# Patient Record
Sex: Male | Born: 1980 | Race: White | Hispanic: No | Marital: Married | Smoking: Former smoker
Health system: Southern US, Community
[De-identification: ages and names within clinical notes are randomized; demographics above are authoritative.]

## PROBLEM LIST (undated history)

## (undated) DIAGNOSIS — R0683 Snoring: Secondary | ICD-10-CM

## (undated) DIAGNOSIS — R0981 Nasal congestion: Secondary | ICD-10-CM

## (undated) DIAGNOSIS — J342 Deviated nasal septum: Secondary | ICD-10-CM

## (undated) DIAGNOSIS — R040 Epistaxis: Secondary | ICD-10-CM

## (undated) DIAGNOSIS — E785 Hyperlipidemia, unspecified: Secondary | ICD-10-CM

## (undated) HISTORY — DX: Hyperlipidemia, unspecified: E78.5

## (undated) HISTORY — PX: HERNIA REPAIR: SHX51

## (undated) HISTORY — DX: Epistaxis: R04.0

## (undated) HISTORY — PX: TONSILLECTOMY: SUR1361

## (undated) HISTORY — DX: Nasal congestion: R09.81

---

## 2015-07-09 DIAGNOSIS — R030 Elevated blood-pressure reading, without diagnosis of hypertension: Secondary | ICD-10-CM | POA: Insufficient documentation

## 2017-06-26 HISTORY — PX: VASECTOMY: SHX75

## 2019-10-16 ENCOUNTER — Telehealth: Payer: Self-pay

## 2019-10-16 NOTE — Telephone Encounter (Signed)
Please advise 

## 2019-10-16 NOTE — Telephone Encounter (Signed)
Copied from [place] [id number]. Topic: Appointment Scheduling - Scheduling Inquiry for Clinic >> Oct 16, 2019  3:22 PM Sheran Luz wrote: Patient calling to inquire if Dr. Larose Kells would accept him as a new patient. Patient is aware that provider is not accepting new patients right now without approval. Patient states he was recommended by three separate patients of Dr. Larose Kells and is asking to be considered for a new patient appointment.    Thea Alken, Dewain Penning and Corwin Levins- Patient's who recommended patient to contact office to set up NP appointment with Dr. Larose Kells.

## 2019-10-16 NOTE — Telephone Encounter (Signed)
Please arrange a new patient visit at his convenience

## 2019-10-17 NOTE — Telephone Encounter (Signed)
Okay to schedule NP appt at his convenience. Thank you.  

## 2019-10-23 NOTE — Telephone Encounter (Signed)
Done

## 2019-10-26 ENCOUNTER — Other Ambulatory Visit: Payer: Self-pay

## 2019-10-30 ENCOUNTER — Ambulatory Visit: Payer: BC Managed Care – PPO | Admitting: Internal Medicine

## 2019-10-31 ENCOUNTER — Other Ambulatory Visit: Payer: Self-pay

## 2019-10-31 ENCOUNTER — Encounter: Payer: Self-pay | Admitting: Internal Medicine

## 2019-10-31 ENCOUNTER — Ambulatory Visit: Payer: BC Managed Care – PPO | Admitting: Internal Medicine

## 2019-10-31 VITALS — Ht 72.0 in | Wt 195.0 lb

## 2019-10-31 DIAGNOSIS — N5089 Other specified disorders of the male genital organs: Secondary | ICD-10-CM | POA: Diagnosis not present

## 2019-10-31 DIAGNOSIS — E785 Hyperlipidemia, unspecified: Secondary | ICD-10-CM | POA: Diagnosis not present

## 2019-10-31 DIAGNOSIS — J069 Acute upper respiratory infection, unspecified: Secondary | ICD-10-CM | POA: Diagnosis not present

## 2019-10-31 DIAGNOSIS — R0683 Snoring: Secondary | ICD-10-CM | POA: Diagnosis not present

## 2019-10-31 NOTE — Progress Notes (Signed)
Subjective:    Patient ID: George Robles, male    DOB: Sep 04, 1981, 39 y.o.   MRN: [id number]  DOS:  10/31/2019 Type of visit - description: Virtual Visit via Video Note  I connected with the above patient  by a video enabled telemedicine application and verified that I am speaking with the correct person using two identifiers.   THIS ENCOUNTER IS A VIRTUAL VISIT DUE TO COVID-19 - PATIENT WAS NOT SEEN IN THE OFFICE. PATIENT HAS CONSENTED TO VIRTUAL VISIT / TELEMEDICINE VISIT   Location of patient: home  Location of provider: office  I discussed the limitations of evaluation and management by telemedicine and the availability of in person appointments. The patient expressed understanding and agreed to proceed.  History of Present Illness: New patient The patient has multiple concerns  History of high cholesterol, on no medications.  Has excellent lifestyle  2 weeks ago developed sore throat runny nose, also some bloody nasal discharge. At the time he did not have fever, chills. No nausea, vomiting, diarrhea. No cough or difficulty breathing. In general he feels better now but is still having some congestion early in the morning. On looking back he has on and off nosebleeding mostly from the left side.  Long  history of snoring, he denies feeling tired in the morning and he does not fall asleep inappropriately.   Also, 3 weeks ago noted a lump at the left scrotum, the size of a pea, not tender, seems to be outside the testicle by his own examination.Marland Kitchen  History of vasectomy.   Review of Systems See above  Past Medical History:  Diagnosis Date  . Hyperlipidemia     Past Surgical History:  Procedure Laterality Date  . HERNIA REPAIR     8 months old  . TONSILLECTOMY    . VASECTOMY  06/2017    Social History   Socioeconomic History  . Marital status: Married    Spouse name: Not on file  . Number of children: 2  . Years of education: Not on file  . Highest  education level: Not on file  Occupational History  . Occupation: Data processing manager  Tobacco Use  . Smoking status: Former Smoker    Quit date: 2010    Years since quitting: 11.0  . Smokeless tobacco: Never Used  Substance and Sexual Activity  . Alcohol use: Yes    Alcohol/week: 10.0 standard drinks    Types: 10 Cans of beer per week    Comment: socially   . Drug use: Not Currently  . Sexual activity: Not on file  Other Topics Concern  . Not on file  Social History Narrative   Has twins    Social Determinants of Health   Financial Resource Strain:   . Difficulty of Paying Living Expenses: Not on file  Food Insecurity:   . Worried About Programme researcher, broadcasting/film/video in the Last Year: Not on file  . Ran Out of Food in the Last Year: Not on file  Transportation Needs:   . Lack of Transportation (Medical): Not on file  . Lack of Transportation (Non-Medical): Not on file  Physical Activity:   . Days of Exercise per Week: Not on file  . Minutes of Exercise per Session: Not on file  Stress:   . Feeling of Stress : Not on file  Social Connections:   . Frequency of Communication with Friends and Family: Not on file  . Frequency of Social Gatherings with Friends and Family:  Not on file  . Attends Religious Services: Not on file  . Active Member of Clubs or Organizations: Not on file  . Attends Archivist Meetings: Not on file  . Marital Status: Not on file  Intimate Partner Violence:   . Fear of Current or Ex-Partner: Not on file  . Emotionally Abused: Not on file  . Physically Abused: Not on file  . Sexually Abused: Not on file     Family History  Problem Relation Age of Onset  . Hyperlipidemia Father   . Cancer Maternal Grandmother   . Heart disease Paternal Grandmother   . Hyperlipidemia Paternal Grandmother   . Diabetes Paternal Grandmother   . Emphysema Paternal Grandfather   . Colon cancer Neg Hx   . Prostate cancer Neg Hx      Allergies as of 10/31/2019   No Known  Allergies     Medication List       Accurate as of October 31, 2019 11:59 PM. If you have any questions, ask your nurse or doctor.        Red Yeast Rice 600 MG Caps Take 2 capsules by mouth daily.           Objective:   Physical Exam Ht 6' (1.829 m)   Wt 195 lb (88.5 kg)   BMI 26.45 kg/m  Is a virtual video visit The patient is alert oriented x3 in no apparent distress    Assessment    Assessment Hyperlipidemia Snoring  PLAN New patient Dyslipidemia: Never took medication, has excellent lifestyle, currently training for marathon, also take yeast rice twice a day.  We will check his FLP when he comes back in person URI, nosebleeds: The patient has a runny nose and sore throat since Christmas, never had any other symptoms, my suspicion for Covid is low, other family members not symptomatic, he has the option to be check for Covid at his job and that would be okay, advised him to keep me posted. Consider ENT referral for persistent, on and off nosebleeds Snoring: Chronic snoring without oral symptoms, BMI is 26, suspicion for OSA is low.  Advised to consider seeing his dentist and get an appliance.  At some point we could arrange for sleep study if needed. Mass, left scrotum: Unable to assess that virtually, will get a ultrasound RTC in person CPX in 2 weeks.  If he continue with runny nose with no other Covid symptoms okay to be seen.   Today, I spent more than  30  min with the patient: >50% of the time counseling regards to his multiple new problems including snoring, ordering a test to evaluate the left scrotum.    I discussed the assessment and treatment plan with the patient. The patient was provided an opportunity to ask questions and all were answered. The patient agreed with the plan and demonstrated an understanding of the instructions.   The patient was advised to call back or seek an in-person evaluation if the symptoms worsen or if the condition fails to  improve as anticipated.

## 2019-11-01 DIAGNOSIS — R0683 Snoring: Secondary | ICD-10-CM | POA: Insufficient documentation

## 2019-11-01 DIAGNOSIS — E785 Hyperlipidemia, unspecified: Secondary | ICD-10-CM | POA: Insufficient documentation

## 2019-11-01 DIAGNOSIS — Z09 Encounter for follow-up examination after completed treatment for conditions other than malignant neoplasm: Secondary | ICD-10-CM | POA: Insufficient documentation

## 2019-11-01 NOTE — Assessment & Plan Note (Signed)
New patient Dyslipidemia: Never took medication, has excellent lifestyle, currently training for marathon, also take yeast rice twice a day.  We will check his FLP when he comes back in person URI, nosebleeds: The patient has a runny nose and sore throat since Christmas, never had any other symptoms, my suspicion for Covid is low, other family members not symptomatic, he has the option to be check for Covid at his job and that would be okay, advised him to keep me posted. Consider ENT referral for persistent, on and off nosebleeds Snoring: Chronic snoring without oral symptoms, BMI is 26, suspicion for OSA is low.  Advised to consider seeing his dentist and get an appliance.  At some point we could arrange for sleep study if needed. Mass, left scrotum: Unable to assess that virtually, will get a ultrasound RTC in person CPX in 2 weeks.  If he continue with runny nose with no other Covid symptoms okay to be seen.

## 2019-11-02 ENCOUNTER — Other Ambulatory Visit: Payer: Self-pay

## 2019-11-02 ENCOUNTER — Ambulatory Visit (HOSPITAL_BASED_OUTPATIENT_CLINIC_OR_DEPARTMENT_OTHER)
Admission: RE | Admit: 2019-11-02 | Discharge: 2019-11-02 | Disposition: A | Discharge status: Home / Self Care | Payer: BC Managed Care – PPO | Source: Ambulatory Visit | Attending: Internal Medicine | Admitting: Internal Medicine

## 2019-11-02 DIAGNOSIS — N5089 Other specified disorders of the male genital organs: Secondary | ICD-10-CM | POA: Diagnosis present

## 2019-11-15 ENCOUNTER — Encounter: Payer: Self-pay | Admitting: Internal Medicine

## 2019-11-15 ENCOUNTER — Ambulatory Visit (INDEPENDENT_AMBULATORY_CARE_PROVIDER_SITE_OTHER): Payer: BC Managed Care – PPO | Admitting: Internal Medicine

## 2019-11-15 ENCOUNTER — Other Ambulatory Visit: Payer: Self-pay

## 2019-11-15 ENCOUNTER — Ambulatory Visit: Payer: BC Managed Care – PPO | Admitting: Internal Medicine

## 2019-11-15 VITALS — BP 135/78 | HR 66 | Temp 97.5°F | Resp 16 | Ht 72.0 in | Wt 209.0 lb

## 2019-11-15 DIAGNOSIS — N5089 Other specified disorders of the male genital organs: Secondary | ICD-10-CM | POA: Diagnosis not present

## 2019-11-15 DIAGNOSIS — Z23 Encounter for immunization: Secondary | ICD-10-CM | POA: Diagnosis not present

## 2019-11-15 DIAGNOSIS — Z0000 Encounter for general adult medical examination without abnormal findings: Secondary | ICD-10-CM

## 2019-11-15 DIAGNOSIS — R040 Epistaxis: Secondary | ICD-10-CM | POA: Diagnosis not present

## 2019-11-15 DIAGNOSIS — E785 Hyperlipidemia, unspecified: Secondary | ICD-10-CM

## 2019-11-15 NOTE — Progress Notes (Signed)
Subjective:    Patient ID: George Robles, male    DOB: August 23, 1981, 40 y.o.   MRN: [id number]  DOS:  11/15/2019 Type of visit - description: CPX In general feeling well.  We also discussed other issues: Chronic history of L>R nosebleeds, sometimes severe. He denies easy bruising, blood in the stools or in the urine or gum bleeding.   Review of Systems  Other than above, a 14 point review of systems is negative  v  Past Medical History:  Diagnosis Date  . Hyperlipidemia     Past Surgical History:  Procedure Laterality Date  . HERNIA REPAIR     8 months old  . TONSILLECTOMY    . VASECTOMY  06/2017   Family History  Problem Relation Age of Onset  . Hyperlipidemia Father   . Cancer Maternal Grandmother   . Heart disease Paternal Grandmother   . Hyperlipidemia Paternal Grandmother   . Diabetes Paternal Grandmother   . Emphysema Paternal Grandfather   . Colon cancer Neg Hx   . Prostate cancer Neg Hx         Objective:   Physical Exam BP 135/78 (BP Location: Right Arm, Patient Position: Sitting, Cuff Size: Normal)   Pulse 66   Temp (!) 97.5 F (36.4 C) (Temporal)   Resp 16   Ht 6' (1.829 m)   Wt 209 lb (94.8 kg)   SpO2 99%   BMI 28.35 kg/m   General: Well developed, NAD, BMI noted Neck: No  thyromegaly  HEENT:  Normocephalic . Face symmetric, atraumatic Nose: Anterior nostril normal to inspection. Lungs: CTA B Normal respiratory effort, no intercostal retractions, no accessory muscle use. Heart: RRR,  no murmur.  No pretibial edema bilaterally  Abdomen:  Not distended, soft, non-tender. No rebound or rigidity.   GU: Penis normal, on palpation testicles are normal Scrotum: I do feel a cyst at the proximal left epididymis.  Not tender Skin: Exposed areas without rash. Not pale. Not jaundice Neurologic:  alert & oriented X3.  Speech normal, gait appropriate for age and unassisted Strength symmetric and appropriate for age.   Psych: Cognition and judgment appear intact.  Cooperative with normal attention span and concentration.  Behavior appropriate. No anxious or depressed appearing.     Assessment      Assessment Hyperlipidemia Snoring + FH high cholesterol   PLAN Here for CPX Hyperlipidemia: On red yeast rice, has a strong family history of high cholesterol.  He has a very healthy lifestyle.  He is not fasting but we will go ahead and check a FLP Nosebleeds: Recurrent, sometimes severe, anterior nostrils normal, refer to ENT for possible more detailed exam.  Checking a CBC, PT, PTT Mass, left scrotum: Clinical exam show normal testicles with a lump at the left epididymis however ultrasound is showing question of tubular ectasia.  See report below.  Plan: Self testicular exam, call if any changes, ultrasound in 3 months.  Urology referral if abnormality persists. Ultrasound report: oblong hypoechoic region within LEFT testis 5 x 4 mm in diameter extending 27 mm length, question developing tubular ectasia of the rete testis though this is not definitive; follow-up ultrasound recommended in 6 months to reassess. Epididymal cyst versus spermatocele 2.0 cm greatest size at LEFT epididymal head. Small BILATERAL hydroceles.  Snoring: See last visit RTC 1 year  This visit occurred during the SARS-CoV-2 public health emergency.  Safety protocols were in place, including screening questions prior to the visit, additional usage of staff PPE,  and extensive cleaning of exam room while observing appropriate contact time as indicated for disinfecting solutions.

## 2019-11-15 NOTE — Assessment & Plan Note (Signed)
-  Tdap: Less than 10 years per patient.  No documentation -Flu shot today -CCS: Not indicated -Prostate cancer screening: Not indicated -Labs: CMP, FLP, CBC, PT, PTT, TSH -Has an excellent lifestyle, eating healthy, getting ready for a marathon.

## 2019-11-15 NOTE — Progress Notes (Signed)
Pre visit review using our clinic review tool, if applicable. No additional management support is needed unless otherwise documented below in the visit note. 

## 2019-11-15 NOTE — Patient Instructions (Signed)
GO TO THE LAB : Get the blood work     GO TO THE FRONT DESK Schedule your next appointment   for a physical exam in 1 year  We are going to recheck a ultrasound of your scrotum, please call the office in 3 months to arrange.  If the lump at the testicle changes, please call me immediately.

## 2019-11-16 ENCOUNTER — Encounter: Payer: Self-pay | Admitting: Internal Medicine

## 2019-11-16 DIAGNOSIS — R040 Epistaxis: Secondary | ICD-10-CM

## 2019-11-16 LAB — TSH: TSH: 1.29 u[IU]/mL (ref 0.35–4.50)

## 2019-11-16 LAB — CBC WITH DIFFERENTIAL/PLATELET
Basophils Absolute: 0 10*3/uL (ref 0.0–0.1)
Basophils Relative: 0.8 % (ref 0.0–3.0)
Eosinophils Absolute: 0.1 10*3/uL (ref 0.0–0.7)
Eosinophils Relative: 1.8 % (ref 0.0–5.0)
HCT: 42 % (ref 39.0–52.0)
Hemoglobin: 14.5 g/dL (ref 13.0–17.0)
Lymphocytes Relative: 23.7 % (ref 12.0–46.0)
Lymphs Abs: 1.5 10*3/uL (ref 0.7–4.0)
MCHC: 34.5 g/dL (ref 30.0–36.0)
MCV: 95.8 fl (ref 78.0–100.0)
Monocytes Absolute: 0.6 10*3/uL (ref 0.1–1.0)
Monocytes Relative: 9.1 % (ref 3.0–12.0)
Neutro Abs: 4 10*3/uL (ref 1.4–7.7)
Neutrophils Relative %: 64.6 % (ref 43.0–77.0)
Platelets: 304 10*3/uL (ref 150.0–400.0)
RBC: 4.38 Mil/uL (ref 4.22–5.81)
RDW: 12.6 % (ref 11.5–15.5)
WBC: 6.2 10*3/uL (ref 4.0–10.5)

## 2019-11-16 LAB — LIPID PANEL
Cholesterol: 204 mg/dL — ABNORMAL HIGH (ref 0–200)
HDL: 64 mg/dL (ref >39.00)
LDL Cholesterol: 123 mg/dL — ABNORMAL HIGH (ref 0–99)
NonHDL: 139.93
Total CHOL/HDL Ratio: 3
Triglycerides: 83 mg/dL (ref 0.0–149.0)
VLDL: 16.6 mg/dL (ref 0.0–40.0)

## 2019-11-16 LAB — COMPREHENSIVE METABOLIC PANEL
ALT: 18 U/L (ref 0–53)
AST: 20 U/L (ref 0–37)
Albumin: 4.6 g/dL (ref 3.5–5.2)
Alkaline Phosphatase: 47 U/L (ref 39–117)
BUN: 14 mg/dL (ref 6–23)
CO2: 29 mEq/L (ref 19–32)
Calcium: 9.5 mg/dL (ref 8.4–10.5)
Chloride: 100 mEq/L (ref 96–112)
Creatinine, Ser: 1.02 mg/dL (ref 0.40–1.50)
GFR: 81.6 mL/min (ref >60.00)
Glucose, Bld: 67 mg/dL — ABNORMAL LOW (ref 70–99)
Potassium: 3.8 mEq/L (ref 3.5–5.1)
Sodium: 136 mEq/L (ref 135–145)
Total Bilirubin: 0.6 mg/dL (ref 0.2–1.2)
Total Protein: 7 g/dL (ref 6.0–8.3)

## 2019-11-16 LAB — PROTIME-INR
INR: 1.1 ratio — ABNORMAL HIGH (ref 0.8–1.0)
Prothrombin Time: 12.6 s (ref 9.6–13.1)

## 2019-11-16 LAB — APTT: aPTT: 31.9 s (ref 23.4–32.7)

## 2019-11-17 NOTE — Assessment & Plan Note (Signed)
Here for CPX Hyperlipidemia: On red yeast rice, has a strong family history of high cholesterol.  He has a very healthy lifestyle.  He is not fasting but we will go ahead and check a FLP Nosebleeds: Recurrent, sometimes severe, anterior nostrils normal, refer to ENT for possible more detailed exam.  Checking a CBC, PT, PTT Mass, left scrotum: Clinical exam show normal testicles with a lump at the left epididymis however ultrasound is showing question of tubular ectasia.  See report below.  Plan: Self testicular exam, call if any changes, ultrasound in 3 months.  Urology referral if abnormality persists. Ultrasound report: oblong hypoechoic region within LEFT testis 5 x 4 mm in diameter extending 27 mm length, question developing tubular ectasia of the rete testis though this is not definitive; follow-up ultrasound recommended in 6 months to reassess. Epididymal cyst versus spermatocele 2.0 cm greatest size at LEFT epididymal head. Small BILATERAL hydroceles.  Snoring: See last visit RTC 1 year

## 2019-12-27 HISTORY — PX: NASAL ENDOSCOPY: SHX286

## 2019-12-31 ENCOUNTER — Ambulatory Visit: Payer: BC Managed Care – PPO | Attending: Internal Medicine

## 2019-12-31 DIAGNOSIS — Z23 Encounter for immunization: Secondary | ICD-10-CM | POA: Insufficient documentation

## 2019-12-31 NOTE — Progress Notes (Signed)
   Covid-19 Vaccination Clinic  Name:  George Robles    MRN: [id number] DOB: 18-Jan-1981  12/31/2019  [name] was observed post Covid-19 immunization for 15 minutes without incident. He was provided with Vaccine Information Sheet and instruction to access the V-Safe system.   [name] was instructed to call 911 with any severe reactions post vaccine: Marland Kitchen Difficulty breathing  . Swelling of face and throat  . A fast heartbeat  . A bad rash all over body  . Dizziness and weakness   Immunizations Administered    Name Date Dose VIS Date Route   Pfizer COVID-19 Vaccine 12/31/2019  5:37 PM 0.3 mL 10/06/2019 Intramuscular   Manufacturer: [employer], [employer]   Lot: [id number]   NDC: [address]-4

## 2020-01-18 DIAGNOSIS — R040 Epistaxis: Secondary | ICD-10-CM | POA: Insufficient documentation

## 2020-01-31 ENCOUNTER — Ambulatory Visit: Payer: BC Managed Care – PPO | Attending: Internal Medicine

## 2020-01-31 DIAGNOSIS — Z23 Encounter for immunization: Secondary | ICD-10-CM

## 2020-01-31 NOTE — Progress Notes (Signed)
   Covid-19 Vaccination Clinic  Name:  George Robles    MRN: [id number] DOB: 1981/06/10  01/31/2020  George Robles was observed post Covid-19 immunization for 15 minutes without incident. He was provided with Vaccine Information Sheet and instruction to access the V-Safe system.   George Robles was instructed to call 911 with any severe reactions post vaccine: Marland Kitchen Difficulty breathing  . Swelling of face and throat  . A fast heartbeat  . A bad rash all over body  . Dizziness and weakness   Immunizations Administered    Name Date Dose VIS Date Route   Pfizer COVID-19 Vaccine 01/31/2020  2:09 PM 0.3 mL 10/06/2019 Intramuscular   Manufacturer: [employer], [employer]   Lot: [id number]   NDC: [address]-1

## 2020-02-20 ENCOUNTER — Ambulatory Visit (HOSPITAL_BASED_OUTPATIENT_CLINIC_OR_DEPARTMENT_OTHER): Payer: BC Managed Care – PPO

## 2020-02-22 ENCOUNTER — Ambulatory Visit (HOSPITAL_BASED_OUTPATIENT_CLINIC_OR_DEPARTMENT_OTHER)
Admission: RE | Admit: 2020-02-22 | Discharge: 2020-02-22 | Disposition: A | Discharge status: Home / Self Care | Payer: BC Managed Care – PPO | Source: Ambulatory Visit | Attending: Internal Medicine | Admitting: Internal Medicine

## 2020-02-22 ENCOUNTER — Other Ambulatory Visit: Payer: Self-pay

## 2020-02-22 DIAGNOSIS — N5089 Other specified disorders of the male genital organs: Secondary | ICD-10-CM | POA: Diagnosis not present

## 2020-07-28 IMAGING — US US SCROTUM W/ DOPPLER COMPLETE
1 series · 13 of 25 positions shown · non-contrast
Comparison: None

CLINICAL DATA: Lump within LEFT scrotum

EXAM:
SCROTAL ULTRASOUND
DOPPLER ULTRASOUND OF THE TESTICLES
TECHNIQUE: Complete ultrasound examination of the testicles, epididymis, and
other scrotal structures was performed. Color and spectral Doppler
ultrasound were also utilized to evaluate blood flow to the
testicles.

[Series 1: us scrotum w/ doppler complete · 47 acquisitions, 13 frames shown]
[im 1/47]
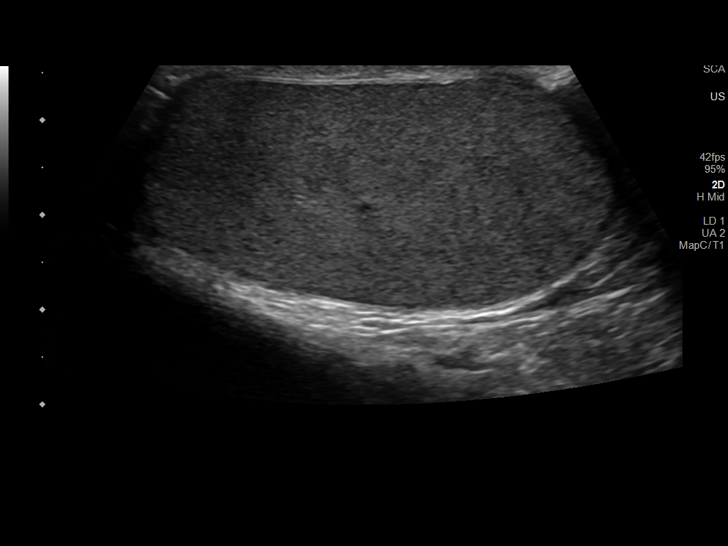
[im 4/47]
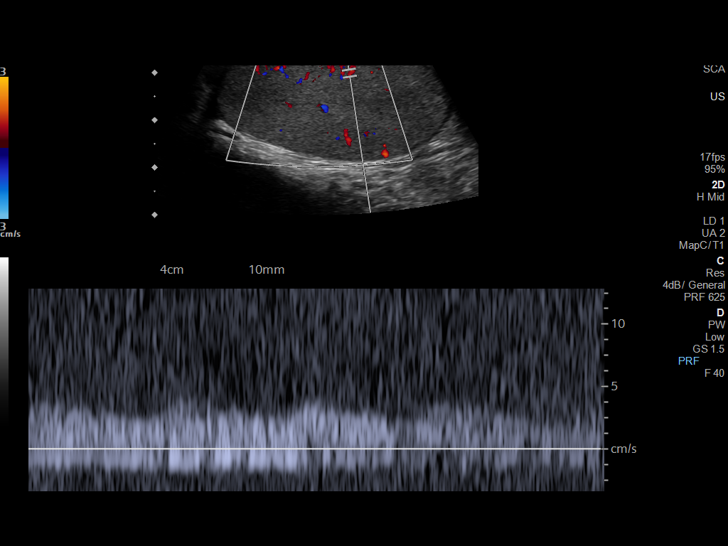
[im 8/47]
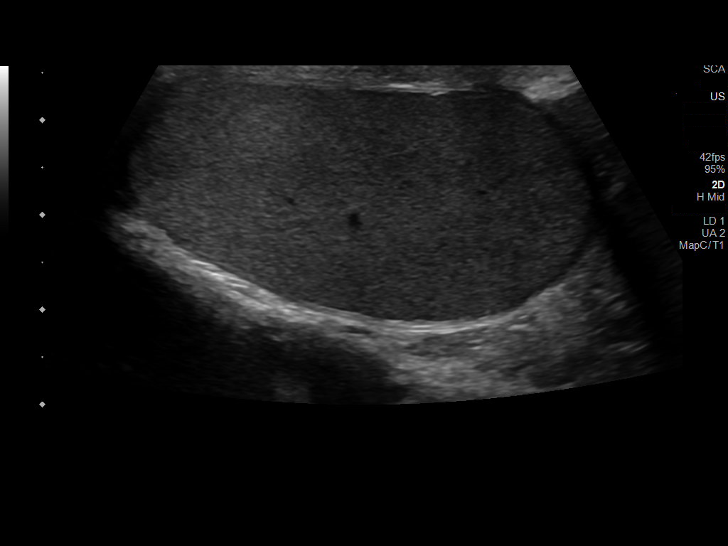
[im 12/47]
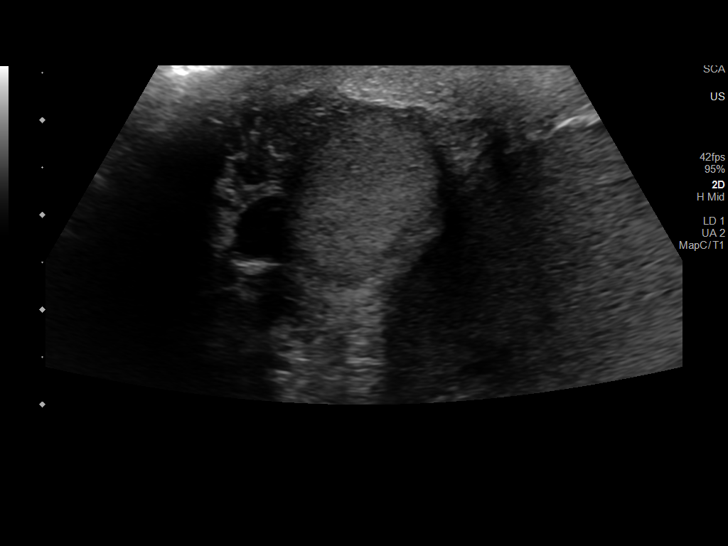
[im 16/47]
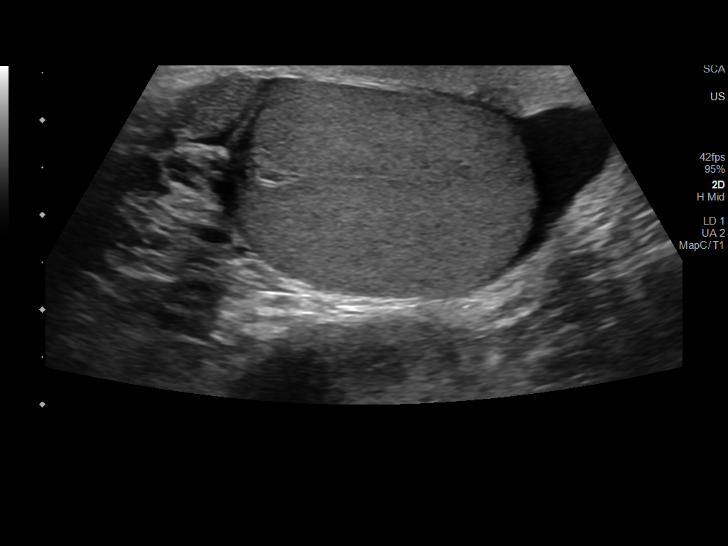
[im 20/47]
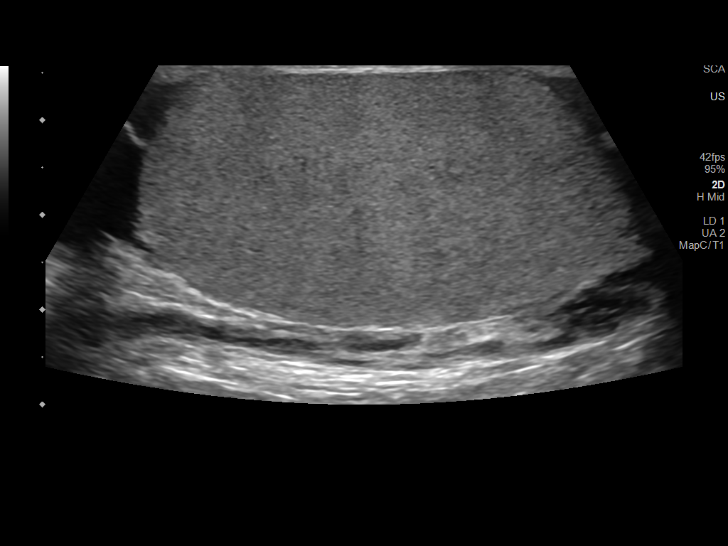
[im 24/47]
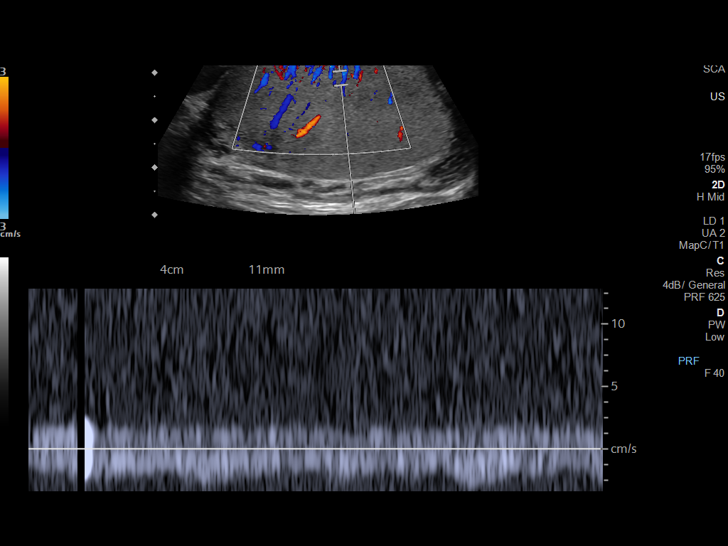
[im 27/47]
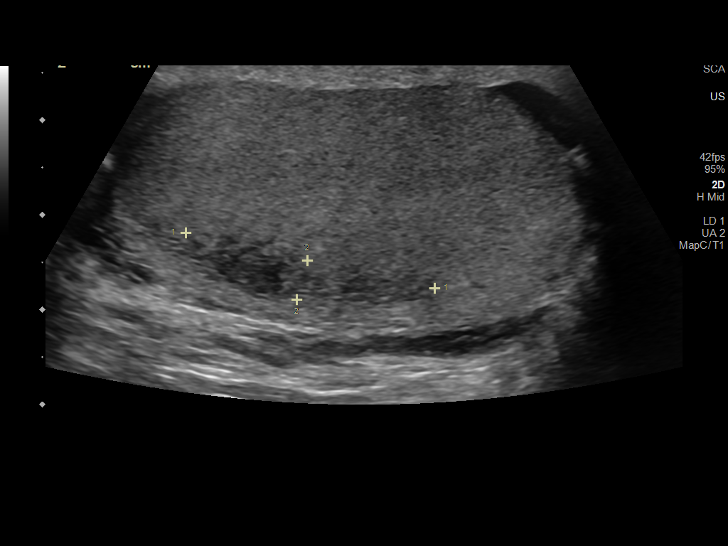
[im 31/47]
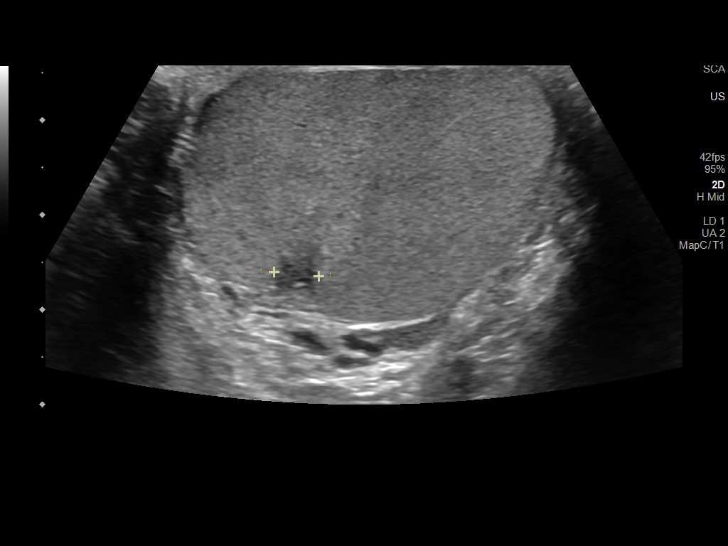
[im 35/47]
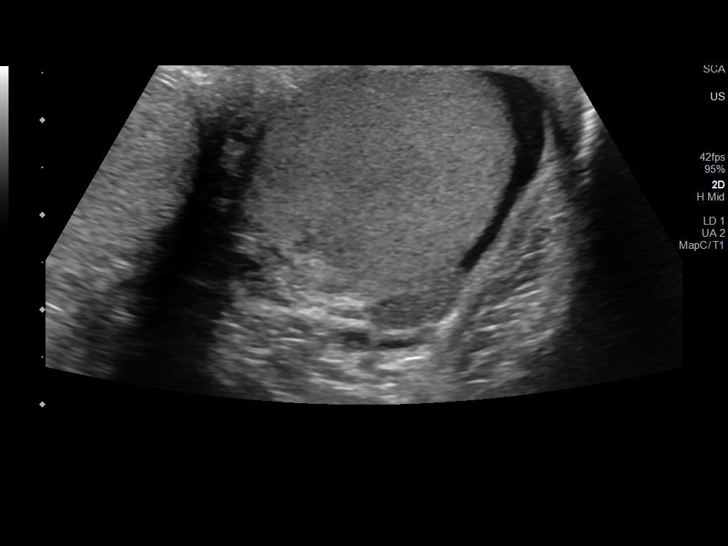
[im 39/47]
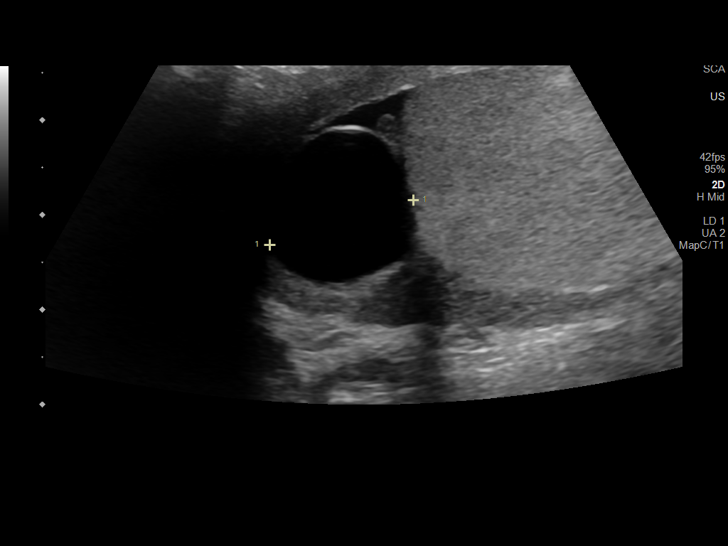
[im 43/47]
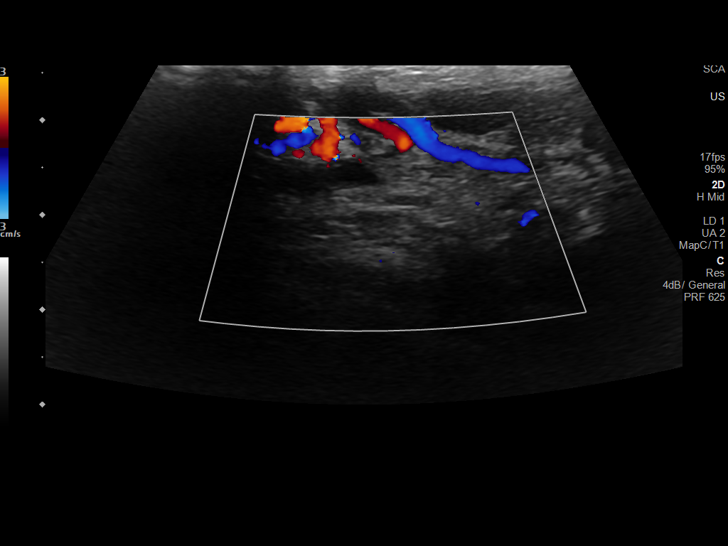
[im 47/47]
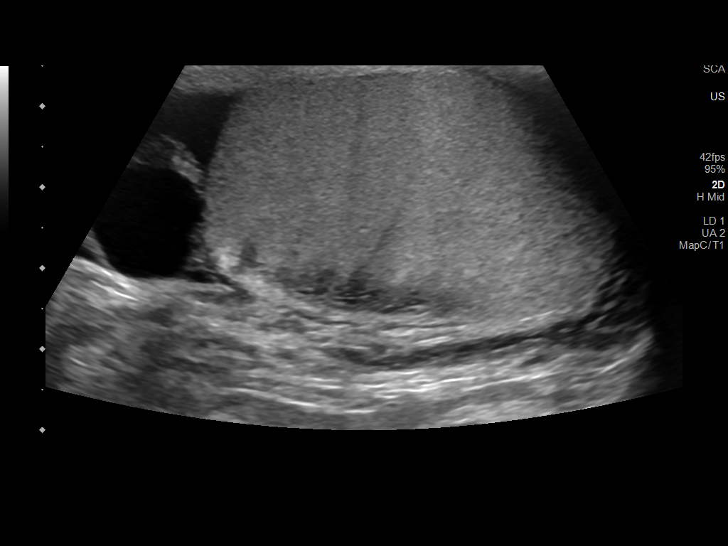

[13 of 25 positions shown; findings below may reference images not displayed]

FINDINGS: Right testicle

Measurements: 5.0 x 2.3 x 3.0 cm. Normal echogenicity without mass
or calcification. Internal blood flow present on color Doppler
imaging.

Left testicle

Measurements: 5.2 x 2.7 x 3.8 cm. Oblong area of slight
hypoechogenicity is seen within LEFT testis, 5 x 4 mm in diameter
extending 2.7 cm length, suspect developing tubular ectasia of the
rete testis though not definitive and mass not excluded at this
point. No displacement of traversing vessels. Internal blood flow
present on color Doppler imaging.

Right epididymis:  Normal in size and appearance.

Left epididymis: Cyst at LEFT epididymal head 2.0 x 1.5 x 1.6 cm,
simple features. Tiny adjacent appendix testis/epididymis noted.

Hydrocele:  Small BILATERAL hydroceles

Varicocele: None definitely visualized; hypervascularity is seen on
color Doppler imaging though no definite vessel dilatation is seen.

Pulsed Doppler interrogation of both testes demonstrates normal low
resistance arterial and venous waveforms bilaterally.
IMPRESSION: Oblong hypoechoic region within LEFT testis 5 x 4 mm in diameter
extending 27 mm length, question developing tubular ectasia of the
rete testis though this is not definitive; follow-up ultrasound
recommended in 6 months to reassess.

Epididymal cyst versus spermatocele 2.0 cm greatest size at LEFT
epididymal head.

Small BILATERAL hydroceles.

## 2020-09-10 ENCOUNTER — Telehealth: Payer: Self-pay | Admitting: Internal Medicine

## 2020-09-10 DIAGNOSIS — N5089 Other specified disorders of the male genital organs: Secondary | ICD-10-CM

## 2020-09-10 NOTE — Telephone Encounter (Signed)
Please arrange for a follow-up scrotal ultrasound, DX abnormal ultrasound

## 2020-09-11 NOTE — Telephone Encounter (Signed)
Order placed. Mychart message sent to Pt.  

## 2020-09-25 ENCOUNTER — Other Ambulatory Visit: Payer: Self-pay

## 2020-09-25 ENCOUNTER — Ambulatory Visit (HOSPITAL_BASED_OUTPATIENT_CLINIC_OR_DEPARTMENT_OTHER)
Admission: RE | Admit: 2020-09-25 | Discharge: 2020-09-25 | Disposition: A | Discharge status: Home / Self Care | Payer: BC Managed Care – PPO | Source: Ambulatory Visit | Attending: Internal Medicine | Admitting: Internal Medicine

## 2020-09-25 DIAGNOSIS — N5089 Other specified disorders of the male genital organs: Secondary | ICD-10-CM | POA: Diagnosis not present

## 2020-11-27 ENCOUNTER — Encounter: Payer: BC Managed Care – PPO | Admitting: Internal Medicine

## 2020-12-02 ENCOUNTER — Encounter: Payer: BC Managed Care – PPO | Admitting: Internal Medicine

## 2021-01-09 ENCOUNTER — Encounter: Payer: BC Managed Care – PPO | Admitting: Internal Medicine

## 2021-01-15 ENCOUNTER — Other Ambulatory Visit: Payer: Self-pay

## 2021-01-15 ENCOUNTER — Encounter: Payer: Self-pay | Admitting: Internal Medicine

## 2021-01-15 ENCOUNTER — Ambulatory Visit (INDEPENDENT_AMBULATORY_CARE_PROVIDER_SITE_OTHER): Payer: BC Managed Care – PPO | Admitting: Internal Medicine

## 2021-01-15 VITALS — BP 132/70 | HR 75 | Temp 97.9°F | Resp 16 | Ht 72.0 in | Wt 208.5 lb

## 2021-01-15 DIAGNOSIS — Z0000 Encounter for general adult medical examination without abnormal findings: Secondary | ICD-10-CM

## 2021-01-15 DIAGNOSIS — Z114 Encounter for screening for human immunodeficiency virus [HIV]: Secondary | ICD-10-CM

## 2021-01-15 DIAGNOSIS — Z1159 Encounter for screening for other viral diseases: Secondary | ICD-10-CM

## 2021-01-15 DIAGNOSIS — E785 Hyperlipidemia, unspecified: Secondary | ICD-10-CM

## 2021-01-15 LAB — LIPID PANEL
Cholesterol: 260 mg/dL — ABNORMAL HIGH (ref 0–200)
HDL: 74.9 mg/dL (ref >39.00)
LDL Cholesterol: 168 mg/dL — ABNORMAL HIGH (ref 0–99)
NonHDL: 184.74
Total CHOL/HDL Ratio: 3
Triglycerides: 86 mg/dL (ref 0.0–149.0)
VLDL: 17.2 mg/dL (ref 0.0–40.0)

## 2021-01-15 LAB — CBC WITH DIFFERENTIAL/PLATELET
Basophils Absolute: 0 10*3/uL (ref 0.0–0.1)
Basophils Relative: 0.6 % (ref 0.0–3.0)
Eosinophils Absolute: 0.1 10*3/uL (ref 0.0–0.7)
Eosinophils Relative: 1 % (ref 0.0–5.0)
HCT: 44.5 % (ref 39.0–52.0)
Hemoglobin: 15.6 g/dL (ref 13.0–17.0)
Lymphocytes Relative: 16.6 % (ref 12.0–46.0)
Lymphs Abs: 1.2 10*3/uL (ref 0.7–4.0)
MCHC: 35.2 g/dL (ref 30.0–36.0)
MCV: 93.9 fl (ref 78.0–100.0)
Monocytes Absolute: 0.7 10*3/uL (ref 0.1–1.0)
Monocytes Relative: 9.1 % (ref 3.0–12.0)
Neutro Abs: 5.3 10*3/uL (ref 1.4–7.7)
Neutrophils Relative %: 72.7 % (ref 43.0–77.0)
Platelets: 273 10*3/uL (ref 150.0–400.0)
RBC: 4.73 Mil/uL (ref 4.22–5.81)
RDW: 13 % (ref 11.5–15.5)
WBC: 7.2 10*3/uL (ref 4.0–10.5)

## 2021-01-15 LAB — COMPREHENSIVE METABOLIC PANEL
ALT: 18 U/L (ref 0–53)
AST: 22 U/L (ref 0–37)
Albumin: 5 g/dL (ref 3.5–5.2)
Alkaline Phosphatase: 46 U/L (ref 39–117)
BUN: 12 mg/dL (ref 6–23)
CO2: 29 mEq/L (ref 19–32)
Calcium: 9.7 mg/dL (ref 8.4–10.5)
Chloride: 98 mEq/L (ref 96–112)
Creatinine, Ser: 1 mg/dL (ref 0.40–1.50)
GFR: 94.82 mL/min (ref >60.00)
Glucose, Bld: 84 mg/dL (ref 70–99)
Potassium: 4.1 mEq/L (ref 3.5–5.1)
Sodium: 136 mEq/L (ref 135–145)
Total Bilirubin: 0.9 mg/dL (ref 0.2–1.2)
Total Protein: 7.5 g/dL (ref 6.0–8.3)

## 2021-01-15 NOTE — Assessment & Plan Note (Signed)
Here for CPX Hyperlipidemia: Diet controlled, diet is very healthy, he is active. Nosebleeds: Saw ENT 10-2019, several hypervascular areas noted, s/p cautery, now rarely has a nosebleed  Mass L scrotum: Follow-up ultrasound 09/25/2020, benign findings.  STE: At baseline. RTC 1 year

## 2021-01-15 NOTE — Progress Notes (Signed)
° °  Subjective:    Patient ID: George Robles, male    DOB: 04/28/1981, 40 y.o.   MRN: [id number]  DOS:  01/15/2021 Type of visit - description: CPX Since the last office visit is doing well. Saw ENT for nosebleed, had a cauterization, nosebleeds significantly decreased   Review of Systems  Other than above, a 14 point review of systems is negative      Past Medical History:  Diagnosis Date   Chronic nasal congestion    Hyperlipidemia    Recurrent epistaxis     Past Surgical History:  Procedure Laterality Date   HERNIA REPAIR     85 months old   NASAL ENDOSCOPY  12/27/2019   TONSILLECTOMY     VASECTOMY  06/2017    Allergies as of 01/15/2021   No Known Allergies     Medication List       Accurate as of January 15, 2021  3:46 PM. If you have any questions, ask your nurse or doctor.        fluticasone 50 MCG/ACT nasal spray Commonly known as: FLONASE Place 2 sprays into both nostrils daily.   psyllium 0.52 g capsule Commonly known as: REGULOID Take 0.52 g by mouth daily.   Red Yeast Rice 600 MG Caps Take 2 capsules by mouth daily.          Objective:   Physical Exam BP 132/70 (BP Location: Left Arm, Patient Position: Sitting, Cuff Size: Normal)    Pulse 75    Temp 97.9 F (36.6 C) (Oral)    Resp 16    Ht 6' (1.829 m)    Wt 208 lb 8 oz (94.6 kg)    SpO2 98%    BMI 28.28 kg/m  General: Well developed, NAD, BMI noted Neck: No  thyromegaly  HEENT:  Normocephalic . Face symmetric, atraumatic Lungs:  CTA B Normal respiratory effort, no intercostal retractions, no accessory muscle use. Heart: RRR,  no murmur.  Abdomen:  Not distended, soft, non-tender. No rebound or rigidity.   Lower extremities: no pretibial edema bilaterally  Skin: Exposed areas without rash. Not pale. Not jaundice Neurologic:  alert & oriented X3.  Speech normal, gait appropriate for age and unassisted Strength symmetric and appropriate for age.  Psych: Cognition  and judgment appear intact.  Cooperative with normal attention span and concentration.  Behavior appropriate. No anxious or depressed appearing.     Assessment      Assessment Hyperlipidemia Snoring + FH high cholesterol   PLAN Here for CPX Hyperlipidemia: Diet controlled, diet is very healthy, he is active. Nosebleeds: Saw ENT 10-2019, several hypervascular areas noted, s/p cautery, now rarely has a nosebleed  Mass L scrotum: Follow-up ultrasound 09/25/2020, benign findings.  STE: At baseline. RTC 1 year  This visit occurred during the SARS-CoV-2 public health emergency.  Safety protocols were in place, including screening questions prior to the visit, additional usage of staff PPE, and extensive cleaning of exam room while observing appropriate contact time as indicated for disinfecting solutions.

## 2021-01-15 NOTE — Patient Instructions (Signed)
  GO TO THE LAB : Get the blood work     GO TO THE FRONT DESK, PLEASE SCHEDULE YOUR APPOINTMENTS Come back for for physical exam in 1 year

## 2021-01-15 NOTE — Assessment & Plan Note (Signed)
-  Tdap: 2018 - covid vax x 3 - had a Flu shot  -CCS: Not indicated -Prostate cancer screening: Not indicated, start age 40 , see FH  -Labs: CMP, FLP, CBC, hep C, HIV -Diet: Very healthy.  Exercise: He is a runner, currently doing 18 miles a week, plans to increase to 35 to 40 miles a week.  Praised.

## 2021-01-16 LAB — HIV ANTIBODY (ROUTINE TESTING W REFLEX): HIV 1&2 Ab, 4th Generation: NONREACTIVE

## 2021-01-16 LAB — HEPATITIS C ANTIBODY
Hepatitis C Ab: NONREACTIVE
SIGNAL TO CUT-OFF: 0.03 (ref <1.00)

## 2021-01-22 ENCOUNTER — Encounter: Payer: Self-pay | Admitting: Internal Medicine

## 2021-01-22 DIAGNOSIS — E785 Hyperlipidemia, unspecified: Secondary | ICD-10-CM

## 2021-01-22 MED ORDER — ROSUVASTATIN CALCIUM 5 MG PO TABS: 5.0000 mg | ORAL_TABLET | Freq: Every day | ORAL | 3 refills | Status: DC

## 2021-02-23 ENCOUNTER — Encounter: Payer: Self-pay | Admitting: Internal Medicine

## 2021-03-05 ENCOUNTER — Other Ambulatory Visit: Payer: BC Managed Care – PPO

## 2021-05-06 ENCOUNTER — Other Ambulatory Visit: Payer: Self-pay

## 2021-05-06 ENCOUNTER — Other Ambulatory Visit (INDEPENDENT_AMBULATORY_CARE_PROVIDER_SITE_OTHER): Payer: BC Managed Care – PPO

## 2021-05-06 DIAGNOSIS — E785 Hyperlipidemia, unspecified: Secondary | ICD-10-CM | POA: Diagnosis not present

## 2021-05-06 LAB — LIPID PANEL
Cholesterol: 192 mg/dL (ref 0–200)
HDL: 72.5 mg/dL (ref >39.00)
LDL Cholesterol: 108 mg/dL — ABNORMAL HIGH (ref 0–99)
NonHDL: 119.76
Total CHOL/HDL Ratio: 3
Triglycerides: 58 mg/dL (ref 0.0–149.0)
VLDL: 11.6 mg/dL (ref 0.0–40.0)

## 2021-05-06 LAB — ALT: ALT: 20 U/L (ref 0–53)

## 2021-05-06 LAB — AST: AST: 22 U/L (ref 0–37)

## 2021-05-08 MED ORDER — ROSUVASTATIN CALCIUM 5 MG PO TABS: 5.0000 mg | ORAL_TABLET | Freq: Every day | ORAL | 3 refills | Status: DC

## 2021-05-08 NOTE — Addendum Note (Signed)
Addended byConrad Twain Harte D on: 05/08/2021 04:47 PM   Modules accepted: Orders

## 2021-06-21 IMAGING — US US SCROTUM W/ DOPPLER COMPLETE
1 series · 13 of 25 positions shown · non-contrast
Comparison: 02/22/2020, 11/02/2019

CLINICAL DATA: Scrotal mass, follow-up

EXAM:
SCROTAL ULTRASOUND
DOPPLER ULTRASOUND OF THE TESTICLES
TECHNIQUE: Complete ultrasound examination of the testicles, epididymis, and
other scrotal structures was performed. Color and spectral Doppler
ultrasound were also utilized to evaluate blood flow to the
testicles.

[Series 1: us scrotum w/ doppler complete · 13 of 41 slices shown]
[im 1/41]
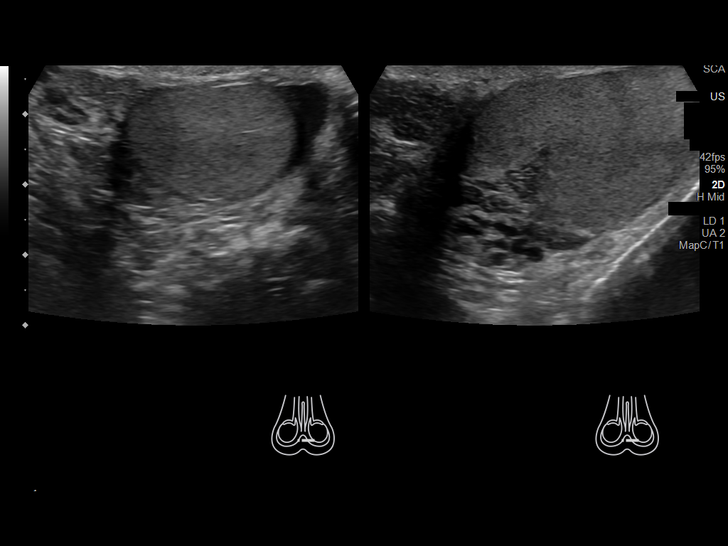
[im 4/41]
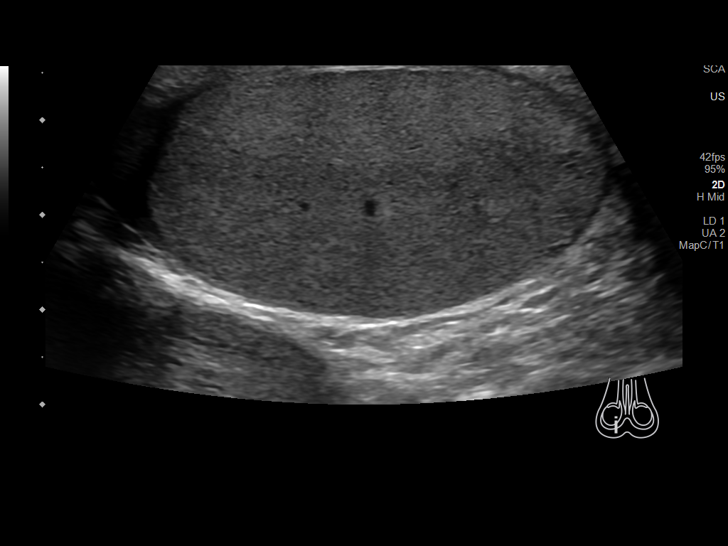
[im 7/41]
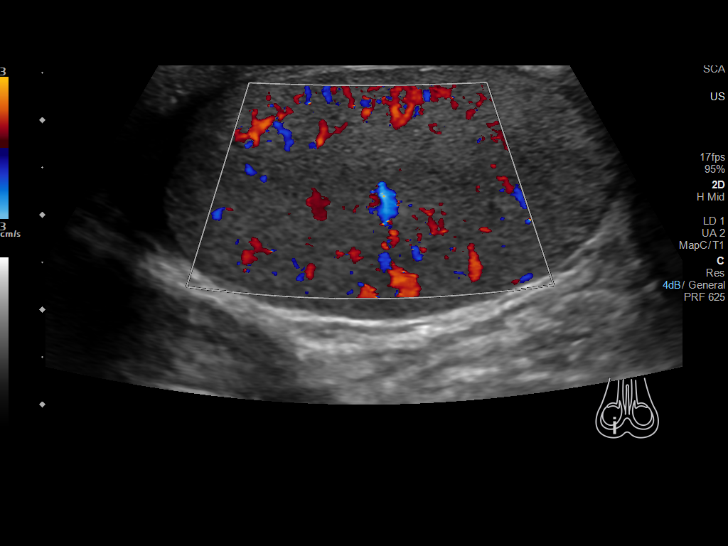
[im 11/41]
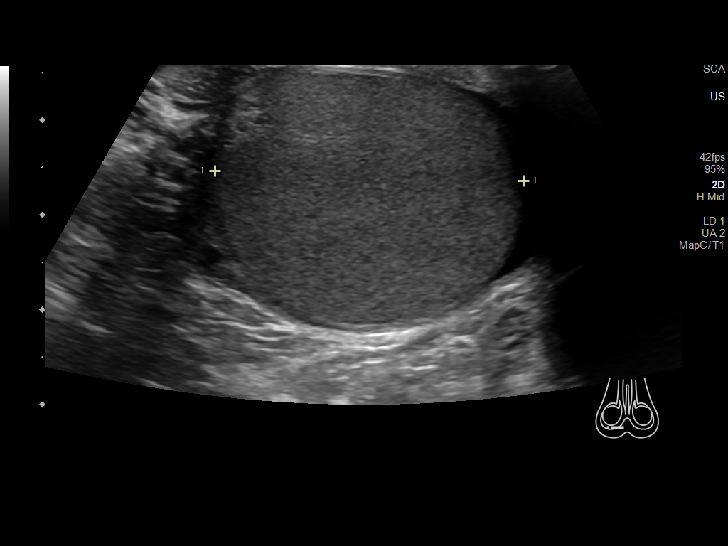
[im 14/41]
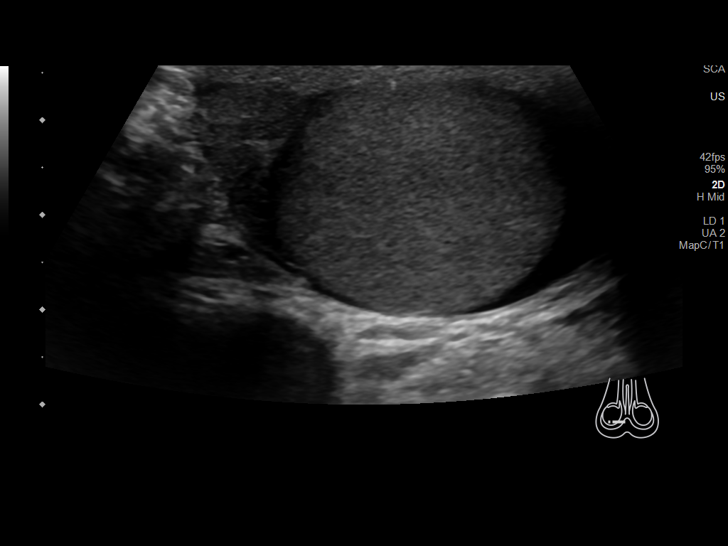
[im 17/41]
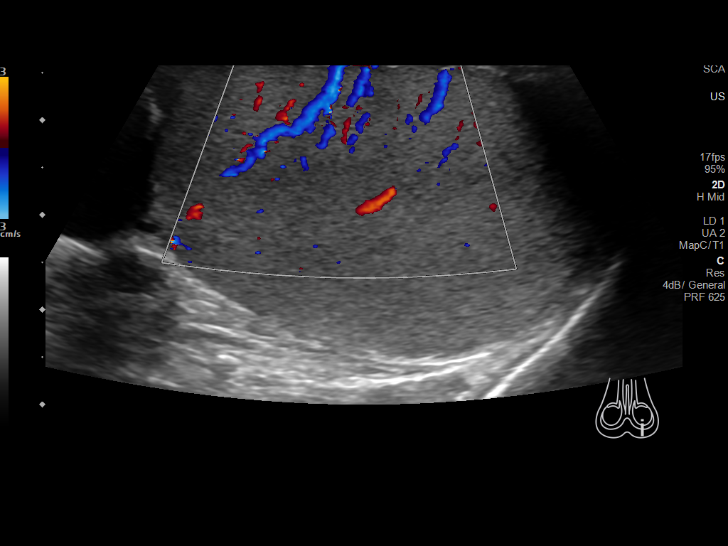
[im 21/41]
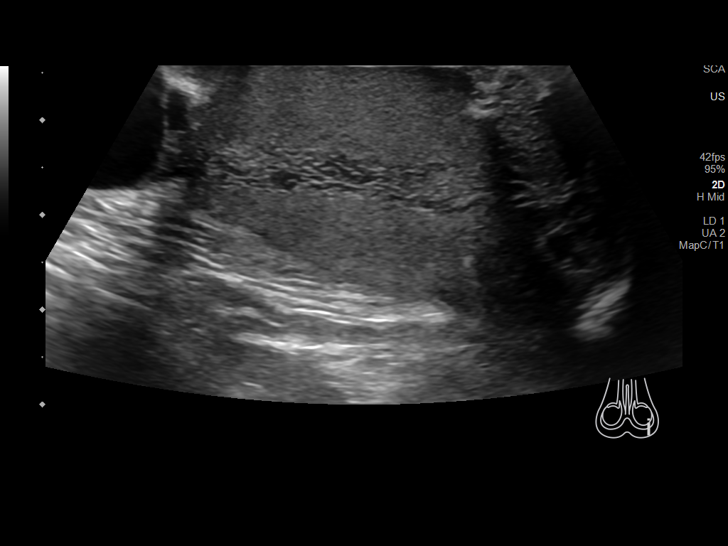
[im 24/41]
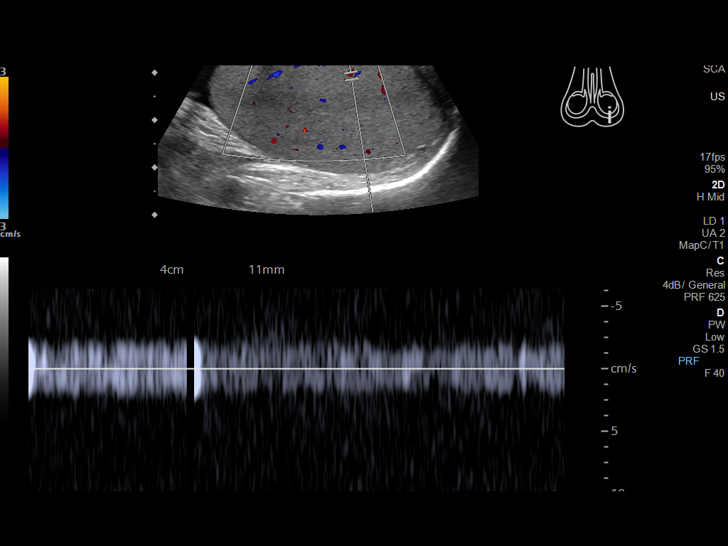
[im 27/41]
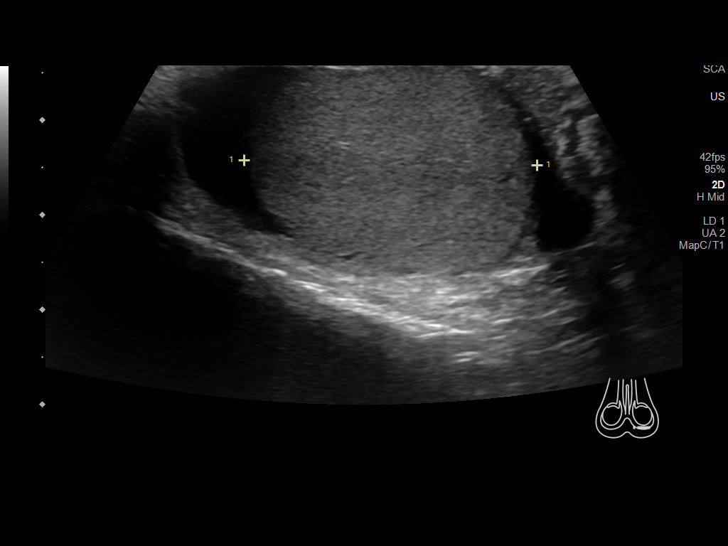
[im 31/41]
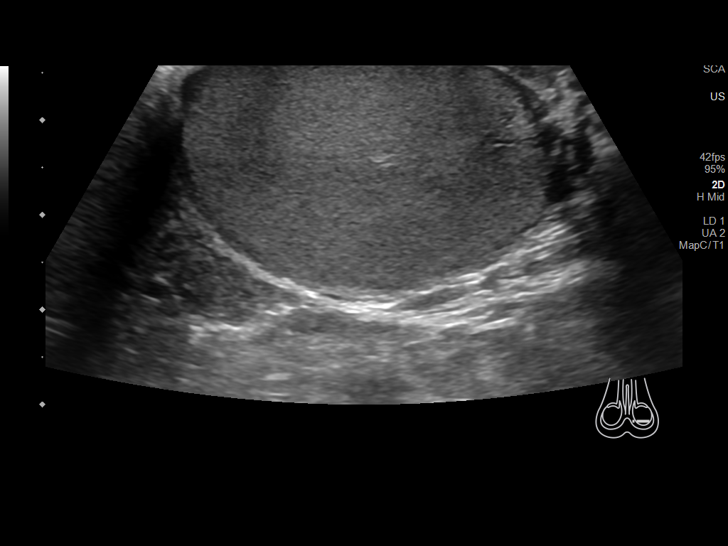
[im 34/41]
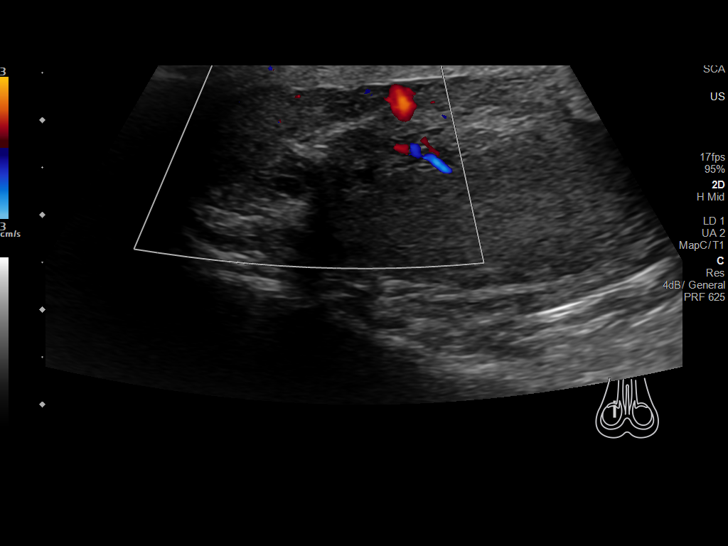
[im 37/41]
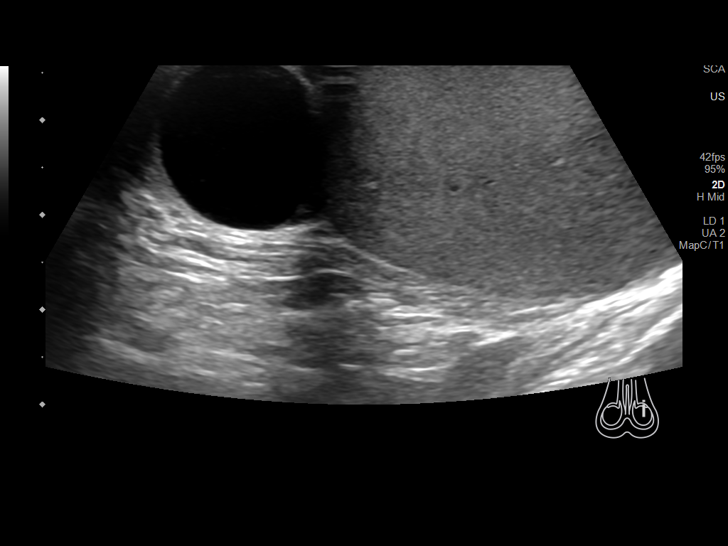
[im 41/41]
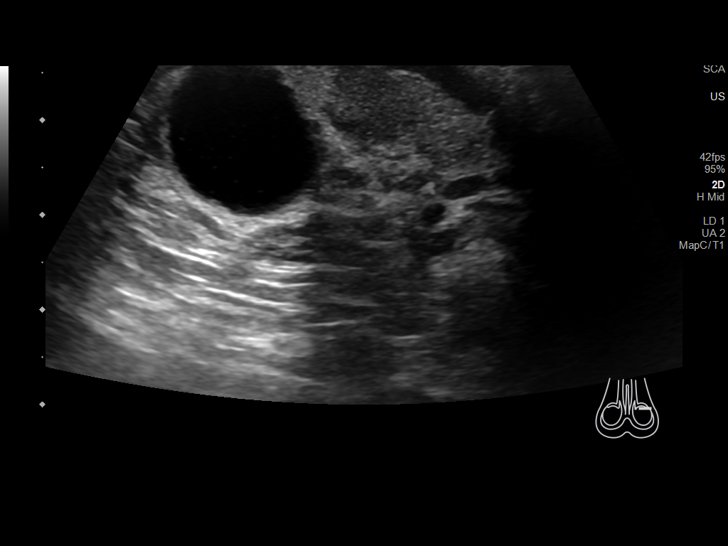

[13 of 25 positions shown; findings below may reference images not displayed]

FINDINGS: Right testicle

Measurements: 4.8 x 2.6 x 3.3 cm. Minimal tubular ectasia of the
rete testis. Otherwise normal parenchymal echogenicity without mass
or calcification. Internal blood flow present on color Doppler
imaging.

Left testicle

Measurements: 5.0 x 2.8 x 3.1 cm. Tubular ectasia of the rete
testis. Otherwise normal parenchymal echogenicity. No mass or
microlithiasis. Internal blood flow present on color Doppler
imaging.

Right epididymis:  Normal in size and appearance.

Left epididymis: 18 x 17 x 21 mm diameter cyst at LEFT epididymal
head again seen

Hydrocele:  Trace BILATERAL hydrocele fluid

Varicocele:  Probable LEFT varicocele

Pulsed Doppler interrogation of both testes demonstrates normal low
resistance arterial and venous waveforms bilaterally.
IMPRESSION: Tubular ectasia of the rete testis bilaterally greater on LEFT.

Cyst at LEFT epididymal head 21 mm greatest size.

Probable LEFT varicocele.

No other scrotal sonographic abnormalities.

## 2021-07-07 ENCOUNTER — Encounter: Payer: Self-pay | Admitting: Internal Medicine

## 2022-01-21 ENCOUNTER — Ambulatory Visit (INDEPENDENT_AMBULATORY_CARE_PROVIDER_SITE_OTHER): Payer: BC Managed Care – PPO | Admitting: Internal Medicine

## 2022-01-21 ENCOUNTER — Encounter: Payer: Self-pay | Admitting: Internal Medicine

## 2022-01-21 VITALS — BP 132/80 | HR 68 | Temp 98.0°F | Resp 16 | Ht 72.0 in | Wt 193.4 lb

## 2022-01-21 DIAGNOSIS — Z0000 Encounter for general adult medical examination without abnormal findings: Secondary | ICD-10-CM | POA: Diagnosis not present

## 2022-01-21 DIAGNOSIS — E785 Hyperlipidemia, unspecified: Secondary | ICD-10-CM | POA: Diagnosis not present

## 2022-01-21 LAB — COMPREHENSIVE METABOLIC PANEL
ALT: 17 U/L (ref 0–53)
AST: 21 U/L (ref 0–37)
Albumin: 4.8 g/dL (ref 3.5–5.2)
Alkaline Phosphatase: 55 U/L (ref 39–117)
BUN: 13 mg/dL (ref 6–23)
CO2: 28 mEq/L (ref 19–32)
Calcium: 9.4 mg/dL (ref 8.4–10.5)
Chloride: 102 mEq/L (ref 96–112)
Creatinine, Ser: 0.89 mg/dL (ref 0.40–1.50)
GFR: 107.2 mL/min (ref >60.00)
Glucose, Bld: 87 mg/dL (ref 70–99)
Potassium: 4.4 mEq/L (ref 3.5–5.1)
Sodium: 138 mEq/L (ref 135–145)
Total Bilirubin: 0.7 mg/dL (ref 0.2–1.2)
Total Protein: 7.2 g/dL (ref 6.0–8.3)

## 2022-01-21 LAB — LIPID PANEL
Cholesterol: 175 mg/dL (ref 0–200)
HDL: 78.6 mg/dL (ref >39.00)
LDL Cholesterol: 87 mg/dL (ref 0–99)
NonHDL: 96.79
Total CHOL/HDL Ratio: 2
Triglycerides: 50 mg/dL (ref 0.0–149.0)
VLDL: 10 mg/dL (ref 0.0–40.0)

## 2022-01-21 LAB — CBC WITH DIFFERENTIAL/PLATELET
Basophils Absolute: 0 10*3/uL (ref 0.0–0.1)
Basophils Relative: 0.6 % (ref 0.0–3.0)
Eosinophils Absolute: 0.3 10*3/uL (ref 0.0–0.7)
Eosinophils Relative: 3.5 % (ref 0.0–5.0)
HCT: 43.9 % (ref 39.0–52.0)
Hemoglobin: 14.7 g/dL (ref 13.0–17.0)
Lymphocytes Relative: 18.4 % (ref 12.0–46.0)
Lymphs Abs: 1.5 10*3/uL (ref 0.7–4.0)
MCHC: 33.6 g/dL (ref 30.0–36.0)
MCV: 96.5 fl (ref 78.0–100.0)
Monocytes Absolute: 0.5 10*3/uL (ref 0.1–1.0)
Monocytes Relative: 6.8 % (ref 3.0–12.0)
Neutro Abs: 5.6 10*3/uL (ref 1.4–7.7)
Neutrophils Relative %: 70.7 % (ref 43.0–77.0)
Platelets: 268 10*3/uL (ref 150.0–400.0)
RBC: 4.55 Mil/uL (ref 4.22–5.81)
RDW: 12.6 % (ref 11.5–15.5)
WBC: 7.9 10*3/uL (ref 4.0–10.5)

## 2022-01-21 LAB — TSH: TSH: 1.47 u[IU]/mL (ref 0.35–5.50)

## 2022-01-21 MED ORDER — ROSUVASTATIN CALCIUM 5 MG PO TABS: 5.0000 mg | ORAL_TABLET | Freq: Every day | ORAL | 3 refills | Status: DC

## 2022-01-21 NOTE — Progress Notes (Signed)
? ?Subjective:  ? ? Patient ID: George Robles, male    DOB: 03-25-1981, 41 y.o.   MRN: [id number] ? ?DOS:  01/21/2022 ?Type of visit - description: CPX ? ?Since the last office visit is doing well. ?Did develop URI few days ago, caught it from his children.  + Runny nose, mild cough.  No fever or chills. ? ?Review of Systems ? ?Other than above, a 14 point review of systems is negative  ? ?  ? ?Past Medical History:  ?Diagnosis Date  ? Chronic nasal congestion   ? Hyperlipidemia   ? Recurrent epistaxis   ? ? ?Past Surgical History:  ?Procedure Laterality Date  ? HERNIA REPAIR    ? 75 months old  ? NASAL ENDOSCOPY  12/27/2019  ? TONSILLECTOMY    ? VASECTOMY  06/2017  ? ?Social History  ? ?Socioeconomic History  ? Marital status: Married  ?  Spouse name: Not on file  ? Number of children: 2  ? Years of education: Not on file  ? Highest education level: Not on file  ?Occupational History  ? Occupation: Data processing manager  ?Tobacco Use  ? Smoking status: Former  ?  Types: Cigarettes  ?  Quit date: 2010  ?  Years since quitting: 13.2  ? Smokeless tobacco: Never  ?Vaping Use  ? Vaping Use: Never used  ?Substance and Sexual Activity  ? Alcohol use: Yes  ?  Alcohol/week: 10.0 standard drinks  ?  Types: 10 Cans of beer per week  ?  Comment: socially   ? Drug use: Not Currently  ? Sexual activity: Not on file  ?Other Topics Concern  ? Not on file  ?Social History Narrative  ? Has twins   ? ?Social Determinants of Health  ? ?Financial Resource Strain: Not on file  ?Food Insecurity: Not on file  ?Transportation Needs: Not on file  ?Physical Activity: Not on file  ?Stress: Not on file  ?Social Connections: Not on file  ?Intimate Partner Violence: Not on file  ? ? ?Current Outpatient Medications  ?Medication Instructions  ? fluticasone (FLONASE) 50 MCG/ACT nasal spray 2 sprays, Each Nare, Daily  ? psyllium (REGULOID) 0.52 g, Oral, Daily  ? rosuvastatin (CRESTOR) 5 mg, Oral, Daily at bedtime  ? ? ?   ?Objective:  ? Physical  Exam ?BP 132/80 (BP Location: Left Arm, Patient Position: Sitting, Cuff Size: Small)   Pulse 68   Temp 98 ?F (36.7 ?C) (Oral)   Resp 16   Ht 6' (1.829 m)   Wt 193 lb 6 oz (87.7 kg)   SpO2 97%   BMI 26.23 kg/m?  ?General: ?Well developed, NAD, BMI noted ?Neck: No  thyromegaly  ?HEENT:  ?Normocephalic . Face symmetric, atraumatic.  Nose congested, TMs slightly bulged no red ?Lungs:  ?CTA B ?Normal respiratory effort, no intercostal retractions, no accessory muscle use. ?Heart: RRR,  no murmur.  ?Abdomen:  ?Not distended, soft, non-tender. No rebound or rigidity.   ?Lower extremities: no pretibial edema bilaterally  ?Skin: Exposed areas without rash. Not pale. Not jaundice ?GU: Scrotal contents: Similar to previous findings by palpated a lump proximal from the left testicle.  Nontender ?Neurologic:  ?alert & oriented X3.  ?Speech normal, gait appropriate for age and unassisted ?Strength symmetric and appropriate for age.  ?Psych: ?Cognition and judgment appear intact.  ?Cooperative with normal attention span and concentration.  ?Behavior appropriate. ?No anxious or depressed appearing. ? ?   ?Assessment   ? ?  ?Assessment ?Hyperlipidemia ?  Chronic rhinitis, snoring ?+ FH high cholesterol  ?L epididymal cyst , [place] x2 last 09/2020 ? ?PLAN ?Here for CPX ?Hyperlipidemia: Based on last cholesterol panel, the patient requested to start a low-dose of atorvastatin, appropriate due to his family history of CAD and high cholesterol.  Tolerates well, subsequently LDL improved.  Recheck FLP today. ?Chronic rhinitis, snoring: ?Has sinus congestion year-round, denies fatigue or feeling sleepy, previously saw ENT, offered turbinate reduction, patient is thinking about it.  In addition to daily Flonase recommend Astepro. ?URI: Started few days ago, call if not improving soon. ?Left epididymal cysts: Clinical exam today seems stable.  Reports STE is normal. ?RTC 1 year ? ?  ? ?This visit occurred during the SARS-CoV-2 public  health emergency.  Safety protocols were in place, including screening questions prior to the visit, additional usage of staff PPE, and extensive cleaning of exam room while observing appropriate contact time as indicated for disinfecting solutions.  ? ?

## 2022-01-21 NOTE — Patient Instructions (Addendum)
Continue with Flonase ? ?Add Astepro over-the-counter 2 sprays on each side of the nose daily ? ?GO TO THE LAB : Get the blood work   ? ? ?GO TO THE FRONT DESK, PLEASE SCHEDULE YOUR APPOINTMENTS ?Come back for a physical exam in 1 year  ?

## 2022-01-22 ENCOUNTER — Encounter: Payer: Self-pay | Admitting: Internal Medicine

## 2022-01-22 NOTE — Assessment & Plan Note (Signed)
Here for CPX ?Hyperlipidemia: Based on last cholesterol panel, the patient requested to start a low-dose of atorvastatin, appropriate due to his family history of CAD and high cholesterol.  Tolerates well, subsequently LDL improved.  Recheck FLP today. ?Chronic rhinitis, snoring: ?Has sinus congestion year-round, denies fatigue or feeling sleepy, previously saw ENT, offered turbinate reduction, patient is thinking about it.  In addition to daily Flonase recommend Astepro. ?URI: Started few days ago, call if not improving soon. ?Left epididymal cysts: Clinical exam today seems stable.  Reports STE is normal. ?RTC 1 year ?

## 2022-01-22 NOTE — Assessment & Plan Note (Signed)
?-  Tdap: 2018 ?- covid vax: utd ?- has Flu shot q year ?-CCS: Not indicated ?-Prostate cancer screening: Not indicated, start age 41 , see FH  ?-Labs:CMP, FLP, CBC, TSH ?-Lifestyle: Continue to be very healthy ?

## 2022-03-18 ENCOUNTER — Encounter: Payer: Self-pay | Admitting: Internal Medicine

## 2022-06-14 ENCOUNTER — Encounter: Payer: Self-pay | Admitting: Internal Medicine

## 2022-06-14 DIAGNOSIS — R0683 Snoring: Secondary | ICD-10-CM

## 2022-09-10 ENCOUNTER — Other Ambulatory Visit: Payer: Self-pay | Admitting: Otolaryngology

## 2022-09-16 ENCOUNTER — Encounter (HOSPITAL_BASED_OUTPATIENT_CLINIC_OR_DEPARTMENT_OTHER): Payer: Self-pay | Admitting: Otolaryngology

## 2022-09-16 ENCOUNTER — Other Ambulatory Visit: Payer: Self-pay

## 2022-09-28 ENCOUNTER — Encounter (HOSPITAL_BASED_OUTPATIENT_CLINIC_OR_DEPARTMENT_OTHER)
Admission: RE | Disposition: A | Discharge status: Home / Self Care | Payer: Self-pay | Source: Ambulatory Visit | Attending: Otolaryngology

## 2022-09-28 ENCOUNTER — Ambulatory Visit (HOSPITAL_BASED_OUTPATIENT_CLINIC_OR_DEPARTMENT_OTHER): Payer: BC Managed Care – PPO | Admitting: Certified Registered"

## 2022-09-28 ENCOUNTER — Encounter (HOSPITAL_BASED_OUTPATIENT_CLINIC_OR_DEPARTMENT_OTHER): Payer: Self-pay | Admitting: Otolaryngology

## 2022-09-28 ENCOUNTER — Ambulatory Visit (HOSPITAL_BASED_OUTPATIENT_CLINIC_OR_DEPARTMENT_OTHER)
Admission: RE | Admit: 2022-09-28 | Discharge: 2022-09-28 | Disposition: A | Discharge status: Home / Self Care | Payer: BC Managed Care – PPO | Source: Ambulatory Visit | Attending: Otolaryngology | Admitting: Otolaryngology

## 2022-09-28 DIAGNOSIS — J343 Hypertrophy of nasal turbinates: Secondary | ICD-10-CM | POA: Insufficient documentation

## 2022-09-28 DIAGNOSIS — Z87891 Personal history of nicotine dependence: Secondary | ICD-10-CM | POA: Diagnosis not present

## 2022-09-28 DIAGNOSIS — J342 Deviated nasal septum: Secondary | ICD-10-CM | POA: Diagnosis not present

## 2022-09-28 DIAGNOSIS — J3489 Other specified disorders of nose and nasal sinuses: Secondary | ICD-10-CM | POA: Diagnosis present

## 2022-09-28 HISTORY — DX: Deviated nasal septum: J34.2

## 2022-09-28 HISTORY — PX: NASAL SEPTOPLASTY W/ TURBINOPLASTY: SHX2070

## 2022-09-28 HISTORY — DX: Snoring: R06.83

## 2022-09-28 SURGERY — SEPTOPLASTY, NOSE, WITH NASAL TURBINATE REDUCTION
Anesthesia: General | Site: Nose | Laterality: Bilateral

## 2022-09-28 MED ORDER — PROPOFOL 10 MG/ML IV BOLUS
INTRAVENOUS | Status: DC | PRN
  Administered 2022-09-28: 200 mg via INTRAVENOUS

## 2022-09-28 MED ORDER — PROMETHAZINE HCL 25 MG/ML IJ SOLN: 6.2500 mg | INTRAMUSCULAR | Status: DC | PRN

## 2022-09-28 MED ORDER — MIDAZOLAM HCL 5 MG/5ML IJ SOLN
INTRAMUSCULAR | Status: DC | PRN
  Administered 2022-09-28: 2 mg via INTRAVENOUS

## 2022-09-28 MED ORDER — ONDANSETRON HCL 4 MG/2ML IJ SOLN
INTRAMUSCULAR | Status: DC | PRN
  Administered 2022-09-28: 4 mg via INTRAVENOUS

## 2022-09-28 MED ORDER — MUPIROCIN 2 % EX OINT
TOPICAL_OINTMENT | CUTANEOUS | Status: AC
  Filled 2022-09-28: qty 22

## 2022-09-28 MED ORDER — CEFAZOLIN SODIUM 1 G IJ SOLR
INTRAMUSCULAR | Status: AC
  Filled 2022-09-28: qty 20

## 2022-09-28 MED ORDER — ROCURONIUM BROMIDE 100 MG/10ML IV SOLN
INTRAVENOUS | Status: DC | PRN
  Administered 2022-09-28: 50 mg via INTRAVENOUS

## 2022-09-28 MED ORDER — OXYCODONE HCL 5 MG/5ML PO SOLN: 5.0000 mg | Freq: Once | ORAL | Status: DC | PRN

## 2022-09-28 MED ORDER — AMISULPRIDE (ANTIEMETIC) 5 MG/2ML IV SOLN
INTRAVENOUS | Status: AC
  Filled 2022-09-28: qty 4

## 2022-09-28 MED ORDER — MUPIROCIN 2 % EX OINT
TOPICAL_OINTMENT | CUTANEOUS | Status: DC | PRN
  Administered 2022-09-28: 1 via NASAL

## 2022-09-28 MED ORDER — OXYMETAZOLINE HCL 0.05 % NA SOLN
NASAL | Status: DC | PRN
  Administered 2022-09-28: 1 via TOPICAL

## 2022-09-28 MED ORDER — SUGAMMADEX SODIUM 500 MG/5ML IV SOLN
INTRAVENOUS | Status: DC | PRN
  Administered 2022-09-28: 400 mg via INTRAVENOUS

## 2022-09-28 MED ORDER — FENTANYL CITRATE (PF) 100 MCG/2ML IJ SOLN
INTRAMUSCULAR | Status: AC
  Filled 2022-09-28: qty 2

## 2022-09-28 MED ORDER — FENTANYL CITRATE (PF) 100 MCG/2ML IJ SOLN
INTRAMUSCULAR | Status: DC | PRN
  Administered 2022-09-28: 100 ug via INTRAVENOUS

## 2022-09-28 MED ORDER — ACETAMINOPHEN 500 MG PO TABS
1000.0000 mg | ORAL_TABLET | Freq: Once | ORAL | Status: AC
  Administered 2022-09-28: 1000 mg via ORAL

## 2022-09-28 MED ORDER — LIDOCAINE-EPINEPHRINE 1 %-1:100000 IJ SOLN
INTRAMUSCULAR | Status: DC | PRN
  Administered 2022-09-28: 5 mL

## 2022-09-28 MED ORDER — DEXAMETHASONE SODIUM PHOSPHATE 4 MG/ML IJ SOLN
INTRAMUSCULAR | Status: DC | PRN
  Administered 2022-09-28: 10 mg via INTRAVENOUS

## 2022-09-28 MED ORDER — LIDOCAINE-EPINEPHRINE 1 %-1:100000 IJ SOLN
INTRAMUSCULAR | Status: AC
  Filled 2022-09-28: qty 1

## 2022-09-28 MED ORDER — AMISULPRIDE (ANTIEMETIC) 5 MG/2ML IV SOLN
10.0000 mg | Freq: Once | INTRAVENOUS | Status: AC
  Administered 2022-09-28: 10 mg via INTRAVENOUS

## 2022-09-28 MED ORDER — CEFAZOLIN SODIUM-DEXTROSE 2-3 GM-%(50ML) IV SOLR
INTRAVENOUS | Status: DC | PRN
  Administered 2022-09-28: 2 g via INTRAVENOUS

## 2022-09-28 MED ORDER — MIDAZOLAM HCL 2 MG/2ML IJ SOLN
INTRAMUSCULAR | Status: AC
  Filled 2022-09-28: qty 2

## 2022-09-28 MED ORDER — FENTANYL CITRATE (PF) 100 MCG/2ML IJ SOLN: 25.0000 ug | INTRAMUSCULAR | Status: DC | PRN

## 2022-09-28 MED ORDER — OXYCODONE-ACETAMINOPHEN 5-325 MG PO TABS: 1.0000 | ORAL_TABLET | ORAL | 0 refills | Status: AC | PRN

## 2022-09-28 MED ORDER — OXYCODONE HCL 5 MG PO TABS: 5.0000 mg | ORAL_TABLET | Freq: Once | ORAL | Status: DC | PRN

## 2022-09-28 MED ORDER — AMOXICILLIN 875 MG PO TABS: 875.0000 mg | ORAL_TABLET | Freq: Two times a day (BID) | ORAL | 0 refills | Status: AC

## 2022-09-28 MED ORDER — LACTATED RINGERS IV SOLN: INTRAVENOUS | Status: DC

## 2022-09-28 MED ORDER — ACETAMINOPHEN 500 MG PO TABS
ORAL_TABLET | ORAL | Status: AC
  Filled 2022-09-28: qty 2

## 2022-09-28 MED ORDER — LIDOCAINE HCL (CARDIAC) PF 100 MG/5ML IV SOSY
PREFILLED_SYRINGE | INTRAVENOUS | Status: DC | PRN
  Administered 2022-09-28: 50 mg via INTRAVENOUS

## 2022-09-28 SURGICAL SUPPLY — 30 items
ATTRACTOMAT 16X20 MAGNETIC DRP (DRAPES) IMPLANT
CANISTER SUCT 1200ML W/VALVE (MISCELLANEOUS) ×1 IMPLANT
COAGULATOR SUCT 8FR VV (MISCELLANEOUS) ×1 IMPLANT
DEFOGGER MIRROR 1QT (MISCELLANEOUS) ×1 IMPLANT
DRSG NASOPORE 8CM (GAUZE/BANDAGES/DRESSINGS) IMPLANT
DRSG TELFA 3X8 NADH STRL (GAUZE/BANDAGES/DRESSINGS) IMPLANT
ELECT REM PT RETURN 9FT ADLT (ELECTROSURGICAL) ×1
ELECTRODE REM PT RTRN 9FT ADLT (ELECTROSURGICAL) ×1 IMPLANT
GLOVE BIO SURGEON STRL SZ7.5 (GLOVE) ×1 IMPLANT
GOWN STRL REUS W/ TWL LRG LVL3 (GOWN DISPOSABLE) ×2 IMPLANT
GOWN STRL REUS W/TWL LRG LVL3 (GOWN DISPOSABLE) ×4
NDL HYPO 25X1 1.5 SAFETY (NEEDLE) ×1 IMPLANT
NEEDLE HYPO 25X1 1.5 SAFETY (NEEDLE) ×1 IMPLANT
NS IRRIG 1000ML POUR BTL (IV SOLUTION) ×1 IMPLANT
PACK BASIN DAY SURGERY FS (CUSTOM PROCEDURE TRAY) ×1 IMPLANT
PACK ENT DAY SURGERY (CUSTOM PROCEDURE TRAY) ×1 IMPLANT
SLEEVE SCD COMPRESS KNEE MED (STOCKING) IMPLANT
SPIKE FLUID TRANSFER (MISCELLANEOUS) IMPLANT
SPLINT NASAL AIRWAY SILICONE (MISCELLANEOUS) ×1 IMPLANT
SPONGE GAUZE 2X2 8PLY STRL LF (GAUZE/BANDAGES/DRESSINGS) ×1 IMPLANT
SPONGE NEURO XRAY DETECT 1X3 (DISPOSABLE) ×1 IMPLANT
SUT CHROMIC 4 0 P 3 18 (SUTURE) ×1 IMPLANT
SUT PLAIN 4 0 ~~LOC~~ 1 (SUTURE) ×1 IMPLANT
SUT PROLENE 3 0 PS 2 (SUTURE) ×1 IMPLANT
SUT VIC AB 4-0 P-3 18XBRD (SUTURE) IMPLANT
SUT VIC AB 4-0 P3 18 (SUTURE)
TOWEL GREEN STERILE FF (TOWEL DISPOSABLE) ×1 IMPLANT
TUBE SALEM SUMP 12F (TUBING) IMPLANT
TUBE SALEM SUMP 16F (TUBING) ×1 IMPLANT
YANKAUER SUCT BULB TIP NO VENT (SUCTIONS) ×1 IMPLANT

## 2022-09-28 NOTE — Discharge Instructions (Addendum)
POSTOPERATIVE INSTRUCTIONS FOR PATIENTS HAVING NASAL OR SINUS OPERATIONS ACTIVITY: Restrict activity at home for the first two days, resting as much as possible. Light activity is best. You may usually return to work within a week. You should refrain from nose blowing, strenuous activity, or heavy lifting greater than 20lbs for a total of one week after your operation.  If sneezing cannot be avoided, sneeze with your mouth open. DISCOMFORT: You may experience a dull headache and pressure along with nasal congestion and discharge. These symptoms may be worse during the first week after the operation but may last as long as two to four weeks.  Please take Tylenol or the pain medication that has been prescribed for you. Do not take aspirin or aspirin containing medications since they may cause bleeding.  You may experience symptoms of post nasal drainage, nasal congestion, headaches and fatigue for two or three months after your operation.  BLEEDING: You may have some blood tinged nasal drainage for approximately two weeks after the operation.  The discharge will be worse for the first week.  Please call our office at [phone] or go to the nearest hospital emergency room if you experience any of the following: heavy, bright red blood from your nose or mouth that lasts longer than 15 minutes or coughing up or vomiting bright red blood or blood clots. GENERAL CONSIDERATIONS: A gauze dressing will be placed on your upper lip to absorb any drainage after the operation. You may need to change this several times a day.  If you do not have very much drainage, you may remove the dressing.  Remember that you may gently wipe your nose with a tissue and sniff in, but DO NOT blow your nose. Please keep all of your postoperative appointments.  Your final results after the operation will depend on proper follow-up.  The initial visit is usually 2 to 5 days after the operation.  During this visit, the remaining nasal  packing and internal septal splints will be removed.  Your nasal and sinus cavities will be cleaned.  During the second visit, your nasal and sinus cavities will be cleaned again. Have someone drive you to your first two postoperative appointments.  How you care for your nose after the operation will influence the results that you obtain.  You should follow all directions, take your medication as prescribed, and call our office [phone] with any problems or questions. You may be more comfortable sleeping with your head elevated on two pillows. Do not take any medications that we have not prescribed or recommended. WARNING SIGNS: if any of the following should occur, please call our office: Persistent fever greater than 102F. Persistent vomiting. Severe and constant pain that is not relieved by prescribed pain medication. Trauma to the nose. Rash or unusual side effects from any medicines.    Post Anesthesia Home Care Instructions  Activity: Get plenty of rest for the remainder of the day. A responsible individual must stay with you for 24 hours following the procedure.  For the next 24 hours, DO NOT: -Drive a car -Paediatric nurse -Drink alcoholic beverages -Take any medication unless instructed by your physician -Make any legal decisions or sign important papers.  Meals: Start with liquid foods such as gelatin or soup. Progress to regular foods as tolerated. Avoid greasy, spicy, heavy foods. If nausea and/or vomiting occur, drink only clear liquids until the nausea and/or vomiting subsides. Call your physician if vomiting continues.  Special Instructions/Symptoms: Your throat may feel dry  or sore from the anesthesia or the breathing tube placed in your throat during surgery. If this causes discomfort, gargle with warm salt water. The discomfort should disappear within 24 hours.  If you had a scopolamine patch placed behind your ear for the management of post- operative nausea  and/or vomiting:  1. The medication in the patch is effective for 72 hours, after which it should be removed.  Wrap patch in a tissue and discard in the trash. Wash hands thoroughly with soap and water. 2. You may remove the patch earlier than 72 hours if you experience unpleasant side effects which may include dry mouth, dizziness or visual disturbances. 3. Avoid touching the patch. Wash your hands with soap and water after contact with the patch.  No tylenol until after 2pm if needed today.

## 2022-09-28 NOTE — Op Note (Signed)
DATE OF PROCEDURE: 09/28/2022  OPERATIVE REPORT   SURGEON: Newman Pies, MD   PREOPERATIVE DIAGNOSES:  1. Severe nasal septal deviation.  2. Bilateral inferior turbinate hypertrophy.  3. Chronic nasal obstruction.  POSTOPERATIVE DIAGNOSES:  1. Severe nasal septal deviation.  2. Bilateral inferior turbinate hypertrophy.  3. Chronic nasal obstruction.  PROCEDURE PERFORMED:  1. Septoplasty.  2. Bilateral partial inferior turbinate resection.   ANESTHESIA: General endotracheal tube anesthesia.   COMPLICATIONS: None.   ESTIMATED BLOOD LOSS: 150 mL.   INDICATION FOR PROCEDURE: George Robles is a 41 y.o. male with a history of chronic nasal obstruction. The patient was treated with antihistamine, decongestant, and steroid nasal sprays. However, the patient continued to be symptomatic. On examination, the patient was noted to have bilateral severe inferior turbinate hypertrophy and significant nasal septal deviation, causing significant nasal obstruction. Based on the above findings, the decision was made for the patient to undergo the above-stated procedures. The risks, benefits, alternatives, and details of the procedures were discussed with the patient. Questions were invited and answered. Informed consent was obtained.   DESCRIPTION OF PROCEDURE: The patient was taken to the operating room and placed supine on the operating table. General endotracheal tube anesthesia was administered by the anesthesiologist. The patient was positioned, and prepped and draped in the standard fashion for nasal surgery. Pledgets soaked with Afrin were placed in both nasal cavities for decongestion. The pledgets were subsequently removed.   Examination of the nasal cavity revealed a severe nasal septal deviation. 1% lidocaine with 1:100,000 epinephrine was injected onto the nasal septum bilaterally. A hemitransfixion incision was made on the left side. The mucosal flap was carefully elevated on the left  side. A cartilaginous incision was made 1 cm superior to the caudal margin of the nasal septum. Mucosal flap was also elevated on the right side in the similar fashion. It should be noted that due to the severe septal deviation, the deviated portion of the cartilaginous and bony septum had to be removed in piecemeal fashion. Once the deviated portions were removed, a straight midline septum was achieved. The septum was then quilted with 4-0 plain gut sutures. The hemitransfixion incision was closed with interrupted 4-0 chromic sutures.   The inferior one half of both hypertrophied inferior turbinate was crossclamped with a Kelly clamp. The inferior one half of each inferior turbinate was then resected with a pair of cross cutting scissors. Hemostasis was achieved with a suction cautery device. Doyle splints were applied to the nasal septum.  The care of the patient was turned over to the anesthesiologist. The patient was awakened from anesthesia without difficulty. The patient was extubated and transferred to the recovery room in good condition.   OPERATIVE FINDINGS: Nasal septal deviation and bilateral inferior turbinate hypertrophy.   SPECIMEN: None.   FOLLOWUP CARE: The patient be discharged home once he is awake and alert. The patient will follow up in my office in 3 days for splint removal.   Fe Okubo Philomena Doheny, MD

## 2022-09-28 NOTE — Anesthesia Preprocedure Evaluation (Addendum)
Anesthesia Evaluation  Patient identified by MRN, date of birth, ID band Patient awake    Reviewed: Allergy & Precautions, NPO status , Patient's Chart, lab work & pertinent test results  History of Anesthesia Complications Negative for: history of anesthetic complications  Airway Mallampati: II  TM Distance: >3 FB Neck ROM: Full    Dental  (+) Dental Advisory Given, Teeth Intact   Pulmonary former smoker   Pulmonary exam normal        Cardiovascular negative cardio ROS Normal cardiovascular exam     Neuro/Psych negative neurological ROS  negative psych ROS   GI/Hepatic negative GI ROS, Neg liver ROS,,,  Endo/Other  negative endocrine ROS    Renal/GU negative Renal ROS     Musculoskeletal negative musculoskeletal ROS (+)    Abdominal   Peds  Hematology negative hematology ROS (+)   Anesthesia Other Findings Recurrent epistaxis Chronic nasal congestion Deviated septum   Reproductive/Obstetrics                             Anesthesia Physical Anesthesia Plan  ASA: 1  Anesthesia Plan: General   Post-op Pain Management: Tylenol PO (pre-op)*   Induction: Intravenous  PONV Risk Score and Plan: 2 and Treatment may vary due to age or medical condition, Ondansetron, Dexamethasone and Midazolam  Airway Management Planned: Oral ETT  Additional Equipment: None  Intra-op Plan:   Post-operative Plan: Extubation in OR  Informed Consent: I have reviewed the patients History and Physical, chart, labs and discussed the procedure including the risks, benefits and alternatives for the proposed anesthesia with the patient or authorized representative who has indicated his/her understanding and acceptance.     Dental advisory given  Plan Discussed with: CRNA and Anesthesiologist  Anesthesia Plan Comments:        Anesthesia Quick Evaluation

## 2022-09-28 NOTE — H&P (Signed)
CC: Chronic nasal obstruction  HPI: The patient is a 41 year old male who presents today complaining of chronic nasal obstruction.  The patient was last seen in 2021 for similar medical issues.  At that time, he was noted to have nasal septal deviation and bilateral inferior turbinate hypertrophy.  He was also experiencing recurrent epistaxis.  The epistaxis was controlled with a cauterization procedure.  He was also treated with Flonase nasal spray and nasal saline irrigation.  The patient returns today complaining of persistent nasal obstruction.  The severity of his nasal obstruction has worsened.  Currently he is using Flonase and Astepro on a daily basis.  He is interested in more definitive treatment.   Exam: General: Communicates without difficulty, well nourished, no acute distress. Head: Normocephalic, no evidence injury, no tenderness, facial buttresses intact without stepoff. Eyes: PERRL, EOMI. No scleral icterus, conjunctivae clear. Neuro: CN II exam reveals vision grossly intact.  No nystagmus at any point of gaze. Ears: Auricles well formed without lesions.  Ear canals are intact without mass or lesion.  No erythema or edema is appreciated.  The TMs are intact without fluid. Nose: External evaluation reveals normal support and skin without lesions.  Dorsum is intact.  Anterior rhinoscopy reveals congested and edematous mucosa over anterior aspect of the inferior turbinates and nasal septum.  No purulence is noted. Middle meatus is not well visualized. Oral:  Oral cavity and oropharynx are intact, symmetric, without erythema or edema.  Mucosa is moist without lesions. Neck: Full range of motion without pain.  There is no significant lymphadenopathy.  No masses palpable.  Thyroid bed within normal limits to palpation.  Parotid glands and submandibular glands equal bilaterally without mass.  Trachea is midline. Neuro:  CN 2-12 grossly intact. Gait normal. Vestibular: No nystagmus at any point of  gaze. A Flexible scope was inserted into the right nasal cavity.  Endoscopy of the interior nasal cavity, superior, inferior, and middle meatus was performed. The sphenoid-ethmoid recess was examined. Edematous mucosa was noted.  No polyp, mass, or lesion was appreciated. Nasal septal deviation noted.  Olfactory cleft was clear.  Nasopharynx was clear.  Turbinates were hypertrophied but without mass. The procedure was repeated on the contralateral side with similar findings.  The patient tolerated the procedure well.  Assessment: 1.  Chronic nasal obstruction with nasal mucosal congestion, nasal septal deviation and bilateral inferior turbinate hypertrophy.  More than 95% of his nasal passageways are obstructed bilaterally.   2.  No polyps, mass, or lesions are noted today.   Plan: 1.  The nasal endoscopy findings are reviewed with the patient.  2.  Continue with Flonase and Astepro nasal spray.   3.  In light of his persistent symptoms, he may benefit from surgical intervention with septoplasty and bilateral turbinate reduction.  The risks, benefits, alternatives and details of the procedure are reviewed with the patient.  Questions are invited and answered.  4.  The patient would like to proceed with the procedures.

## 2022-09-28 NOTE — Anesthesia Postprocedure Evaluation (Signed)
Anesthesia Post Note  Patient: George Robles  Procedure(s) Performed: NASAL SEPTOPLASTY WITH BILATERAL TURBINATE REDUCTION (Bilateral: Nose)     Patient location during evaluation: PACU Anesthesia Type: General Level of consciousness: awake and alert Pain management: pain level controlled Vital Signs Assessment: post-procedure vital signs reviewed and stable Respiratory status: spontaneous breathing, nonlabored ventilation and respiratory function stable Cardiovascular status: stable and blood pressure returned to baseline Anesthetic complications: no   No notable events documented.  Last Vitals:  Vitals:   09/28/22 0945 09/28/22 1000  BP: (!) 131/90 108/72  Pulse: 67 (!) 58  Resp: 16 15  Temp:    SpO2: 100% 99%    Last Pain:  Vitals:   09/28/22 1000  TempSrc:   PainSc: 2                  [name]

## 2022-09-28 NOTE — Anesthesia Procedure Notes (Signed)
Procedure Name: Intubation Date/Time: 09/28/2022 8:45 AM  Performed by: Verita Lamb, CRNAPre-anesthesia Checklist: Patient identified, Emergency Drugs available, Suction available and Patient being monitored Patient Re-evaluated:Patient Re-evaluated prior to induction Oxygen Delivery Method: Circle system utilized Preoxygenation: Pre-oxygenation with 100% oxygen Induction Type: IV induction Ventilation: Mask ventilation without difficulty Laryngoscope Size: Mac and 4 Grade View: Grade III Tube type: Oral Tube size: 7.5 mm Number of attempts: 1 Airway Equipment and Method: Stylet and Oral airway Placement Confirmation: ETT inserted through vocal cords under direct vision, positive ETCO2, breath sounds checked- equal and bilateral and CO2 detector Secured at: 23 cm Tube secured with: Tape Dental Injury: Teeth and Oropharynx as per pre-operative assessment

## 2022-09-28 NOTE — Transfer of Care (Signed)
Immediate Anesthesia Transfer of Care Note  Patient: George Robles  Procedure(s) Performed: NASAL SEPTOPLASTY WITH BILATERAL TURBINATE REDUCTION (Bilateral: Nose)  Patient Location: PACU  Anesthesia Type:General  Level of Consciousness: awake, alert , and patient cooperative  Airway & Oxygen Therapy: Patient Spontanous Breathing and Patient connected to face mask oxygen  Post-op Assessment: Report given to RN and Post -op Vital signs reviewed and stable  Post vital signs: Reviewed and stable  Last Vitals:  Vitals Value Taken Time  BP    Temp    Pulse 65 09/28/22 0936  Resp 20 09/28/22 0936  SpO2 100 % 09/28/22 0936  Vitals shown include unvalidated device data.  Last Pain:  Vitals:   09/28/22 0744  TempSrc: Oral  PainSc: 0-No pain         Complications: No notable events documented.

## 2022-09-29 ENCOUNTER — Encounter (HOSPITAL_BASED_OUTPATIENT_CLINIC_OR_DEPARTMENT_OTHER): Payer: Self-pay | Admitting: Otolaryngology

## 2023-01-20 ENCOUNTER — Other Ambulatory Visit: Payer: Self-pay | Admitting: Internal Medicine

## 2023-01-26 ENCOUNTER — Encounter: Payer: BC Managed Care – PPO | Admitting: Internal Medicine

## 2023-03-02 ENCOUNTER — Encounter: Payer: BC Managed Care – PPO | Admitting: Internal Medicine

## 2023-03-05 ENCOUNTER — Encounter: Payer: Self-pay | Admitting: Internal Medicine

## 2023-03-05 ENCOUNTER — Ambulatory Visit (INDEPENDENT_AMBULATORY_CARE_PROVIDER_SITE_OTHER): Payer: BC Managed Care – PPO | Admitting: Internal Medicine

## 2023-03-05 VITALS — BP 136/84 | HR 81 | Temp 98.1°F | Resp 16 | Ht 72.0 in | Wt 213.2 lb

## 2023-03-05 DIAGNOSIS — E785 Hyperlipidemia, unspecified: Secondary | ICD-10-CM

## 2023-03-05 DIAGNOSIS — Z0000 Encounter for general adult medical examination without abnormal findings: Secondary | ICD-10-CM | POA: Diagnosis not present

## 2023-03-05 NOTE — Assessment & Plan Note (Signed)
Here for CPX Hyperlipidemia: Encouraged to continue with a healthy lifestyle.  Check FLP.  Continue Crestor. Chronic rhinitis, snoring: Had a nasal septoplasty bilaterally 09/28/2022.  Rhinitis symptoms much improved.  Still snoring.  Epworth scale: 6, negative for sleep apnea.  If his snoring is an issue he could use a dental appliance. RTC 1 year

## 2023-03-05 NOTE — Progress Notes (Signed)
Subjective:    Patient ID: George Robles, male    DOB: [birth year], 42 y.o.   MRN: [id number]  DOS:  03/05/2023 Type of visit - description: CPX  Here for CPX Since the last visit had nasal septoplasty. Breathing is much better, chronic congestion has decreased. Still smokes significantly.  After 7 PM he is ready to sleep but during the daytime he is not falling asleep inappropriately.  Review of Systems  Other than above, a 14 point review of systems is negative     Past Medical History:  Diagnosis Date   Chronic nasal congestion    Deviated septum    Hyperlipidemia    Recurrent epistaxis    Snoring     Past Surgical History:  Procedure Laterality Date   HERNIA REPAIR     8 months old   NASAL ENDOSCOPY  12/27/2019   NASAL SEPTOPLASTY W/ TURBINOPLASTY Bilateral 09/28/2022   Procedure: NASAL SEPTOPLASTY WITH BILATERAL TURBINATE REDUCTION;  Surgeon: Newman Pies, MD;  Location: [facility];  Service: ENT;  Laterality: Bilateral;   TONSILLECTOMY     VASECTOMY  06/2017   Social History   Socioeconomic History   Marital status: Married    Spouse name: Not on file   Number of children: 2   Years of education: Not on file   Highest education level: Not on file  Occupational History   Occupation: teacher [employer]  Tobacco Use   Smoking status: Former    Types: Cigarettes    Quit date: 2010    Years since quitting: 14.3   Smokeless tobacco: Never  Vaping Use   Vaping Use: Never used  Substance and Sexual Activity   Alcohol use: Yes    Alcohol/week: 10.0 standard drinks of alcohol    Types: 10 Cans of beer per week    Comment: socially    Drug use: Not Currently   Sexual activity: Not on file  Other Topics Concern   Not on file  Social History Narrative   Has twins , 2 boys, 2018   Social Determinants of Health   Financial Resource Strain: Not on file  Food Insecurity: Not on file  Transportation Needs: Not on file  Physical Activity: Not  on file  Stress: Not on file  Social Connections: Not on file  Intimate Partner Violence: Not on file     Current Outpatient Medications  Medication Instructions   psyllium (REGULOID) 0.52 g, Oral, Daily   rosuvastatin (CRESTOR) 5 mg, Oral, Daily at bedtime       Objective:   Physical Exam BP 136/84   Pulse 81   Temp 98.1 F (36.7 C) (Oral)   Resp 16   Ht 6' (1.829 m)   Wt 213 lb 4 oz (96.7 kg)   SpO2 98%   BMI 28.92 kg/m  General: Well developed, NAD, BMI noted Neck: No  thyromegaly  HEENT:  Normocephalic . Face symmetric, atraumatic Lungs:  CTA B Normal respiratory effort, no intercostal retractions, no accessory muscle use. Heart: RRR,  no murmur.  Abdomen:  Not distended, soft, non-tender. No rebound or rigidity.   Lower extremities: no pretibial edema bilaterally  Skin: Exposed areas without rash. Not pale. Not jaundice Neurologic:  alert & oriented X3.  Speech normal, gait appropriate for age and unassisted Strength symmetric and appropriate for age.  Psych: Cognition and judgment appear intact.  Cooperative with normal attention span and concentration.  Behavior appropriate. No anxious or depressed appearing.  Assessment     Assessment Hyperlipidemia Chronic rhinitis, snoring + FH high cholesterol  L epididymal cyst , [place] x2 last 09/2020  PLAN Here for CPX Hyperlipidemia: Encouraged to continue with a healthy lifestyle.  Check FLP.  Continue Crestor. Chronic rhinitis, snoring: Had a nasal septoplasty bilaterally 09/28/2022.  Rhinitis symptoms much improved.  Still snoring.  Epworth scale: 6, negative for sleep apnea.  If his snoring is an issue he could use a dental appliance. RTC 1 year

## 2023-03-05 NOTE — Assessment & Plan Note (Signed)
-   Tdap: 2018 - covid vax: Recommended if not done after 06-2012 - has Flu shot q year -CCS: Not indicated -Prostate cancer screening: Not indicated, start age 42 , see FH  -Labs:BMP AST ALT FLP CBC -Lifestyle: Doing well, runs regularly.

## 2023-03-05 NOTE — Patient Instructions (Signed)
COVID booster recommended if not done by 06-2022  GO TO THE LAB : Get the blood work     GO TO THE FRONT DESK, PLEASE SCHEDULE YOUR APPOINTMENTS Come back for   a physical exam in 1 year

## 2023-03-06 ENCOUNTER — Encounter: Payer: Self-pay | Admitting: Internal Medicine

## 2023-03-06 LAB — CBC WITH DIFFERENTIAL/PLATELET
Absolute Monocytes: 623 cells/uL (ref 200–950)
Basophils Absolute: 60 cells/uL (ref 0–200)
Basophils Relative: 0.8 %
Eosinophils Absolute: 278 cells/uL (ref 15–500)
Eosinophils Relative: 3.7 %
HCT: 43.8 % (ref 38.5–50.0)
Hemoglobin: 15.3 g/dL (ref 13.2–17.1)
Lymphs Abs: 2033 cells/uL (ref 850–3900)
MCH: 32.5 pg (ref 27.0–33.0)
MCHC: 34.9 g/dL (ref 32.0–36.0)
MCV: 93 fL (ref 80.0–100.0)
MPV: 9.7 fL (ref 7.5–12.5)
Monocytes Relative: 8.3 %
Neutro Abs: 4508 cells/uL (ref 1500–7800)
Neutrophils Relative %: 60.1 %
Platelets: 267 10*3/uL (ref 140–400)
RBC: 4.71 10*6/uL (ref 4.20–5.80)
RDW: 12.7 % (ref 11.0–15.0)
Total Lymphocyte: 27.1 %
WBC: 7.5 10*3/uL (ref 3.8–10.8)

## 2023-03-06 LAB — BASIC METABOLIC PANEL
BUN: 9 mg/dL (ref 7–25)
CO2: 27 mmol/L (ref 20–32)
Calcium: 9.6 mg/dL (ref 8.6–10.3)
Chloride: 101 mmol/L (ref 98–110)
Creat: 1.03 mg/dL (ref 0.60–1.29)
Glucose, Bld: 69 mg/dL (ref 65–99)
Potassium: 3.7 mmol/L (ref 3.5–5.3)
Sodium: 138 mmol/L (ref 135–146)

## 2023-03-06 LAB — AST: AST: 21 U/L (ref 10–40)

## 2023-03-06 LAB — LIPID PANEL
Cholesterol: 184 mg/dL (ref <200)
HDL: 63 mg/dL (ref >=40)
LDL Cholesterol (Calc): 103 mg/dL (calc) — ABNORMAL HIGH
Non-HDL Cholesterol (Calc): 121 mg/dL (calc) (ref <130)
Total CHOL/HDL Ratio: 2.9 (calc) (ref <5.0)
Triglycerides: 88 mg/dL (ref <150)

## 2023-03-06 LAB — ALT: ALT: 23 U/L (ref 9–46)

## 2023-03-08 ENCOUNTER — Encounter: Payer: Self-pay | Admitting: Internal Medicine

## 2023-07-28 ENCOUNTER — Encounter: Payer: Self-pay | Admitting: Internal Medicine

## 2023-09-27 ENCOUNTER — Other Ambulatory Visit: Payer: Self-pay | Admitting: Internal Medicine

## 2024-03-07 ENCOUNTER — Ambulatory Visit: Payer: BC Managed Care – PPO | Admitting: Internal Medicine

## 2024-03-07 ENCOUNTER — Encounter: Payer: Self-pay | Admitting: Internal Medicine

## 2024-03-07 VITALS — BP 126/82 | HR 67 | Temp 98.2°F | Resp 16 | Ht 72.0 in | Wt 215.0 lb

## 2024-03-07 DIAGNOSIS — E785 Hyperlipidemia, unspecified: Secondary | ICD-10-CM

## 2024-03-07 DIAGNOSIS — Z0000 Encounter for general adult medical examination without abnormal findings: Secondary | ICD-10-CM | POA: Diagnosis not present

## 2024-03-07 LAB — CBC WITH DIFFERENTIAL/PLATELET
Basophils Absolute: 0 10*3/uL (ref 0.0–0.1)
Basophils Relative: 0.8 % (ref 0.0–3.0)
Eosinophils Absolute: 0.2 10*3/uL (ref 0.0–0.7)
Eosinophils Relative: 4.1 % (ref 0.0–5.0)
HCT: 45.3 % (ref 39.0–52.0)
Hemoglobin: 15.9 g/dL (ref 13.0–17.0)
Lymphocytes Relative: 26.4 % (ref 12.0–46.0)
Lymphs Abs: 1.3 10*3/uL (ref 0.7–4.0)
MCHC: 35.1 g/dL (ref 30.0–36.0)
MCV: 94.3 fl (ref 78.0–100.0)
Monocytes Absolute: 0.4 10*3/uL (ref 0.1–1.0)
Monocytes Relative: 7.3 % (ref 3.0–12.0)
Neutro Abs: 3 10*3/uL (ref 1.4–7.7)
Neutrophils Relative %: 61.4 % (ref 43.0–77.0)
Platelets: 311 10*3/uL (ref 150.0–400.0)
RBC: 4.8 Mil/uL (ref 4.22–5.81)
RDW: 12.6 % (ref 11.5–15.5)
WBC: 4.9 10*3/uL (ref 4.0–10.5)

## 2024-03-07 LAB — LIPID PANEL
Cholesterol: 210 mg/dL — ABNORMAL HIGH (ref 0–200)
HDL: 71.6 mg/dL (ref >39.00)
LDL Cholesterol: 113 mg/dL — ABNORMAL HIGH (ref 0–99)
NonHDL: 138.89
Total CHOL/HDL Ratio: 3
Triglycerides: 129 mg/dL (ref 0.0–149.0)
VLDL: 25.8 mg/dL (ref 0.0–40.0)

## 2024-03-07 LAB — COMPREHENSIVE METABOLIC PANEL WITH GFR
ALT: 20 U/L (ref 0–53)
AST: 20 U/L (ref 0–37)
Albumin: 4.9 g/dL (ref 3.5–5.2)
Alkaline Phosphatase: 44 U/L (ref 39–117)
BUN: 8 mg/dL (ref 6–23)
CO2: 27 meq/L (ref 19–32)
Calcium: 9.6 mg/dL (ref 8.4–10.5)
Chloride: 100 meq/L (ref 96–112)
Creatinine, Ser: 0.99 mg/dL (ref 0.40–1.50)
GFR: 93.88 mL/min (ref >60.00)
Glucose, Bld: 88 mg/dL (ref 70–99)
Potassium: 4.2 meq/L (ref 3.5–5.1)
Sodium: 135 meq/L (ref 135–145)
Total Bilirubin: 0.8 mg/dL (ref 0.2–1.2)
Total Protein: 7.6 g/dL (ref 6.0–8.3)

## 2024-03-07 NOTE — Progress Notes (Unsigned)
   Subjective:    Patient ID: George Robles, male    DOB: [birth year], 43 y.o.   MRN: [id number]  DOS:  03/07/2024 Type of visit - description: Here for CPX  Doing well , no major concerns   Review of Systems See above   Past Medical History:  Diagnosis Date   Chronic nasal congestion    Deviated septum    Hyperlipidemia    Recurrent epistaxis    Snoring     Past Surgical History:  Procedure Laterality Date   HERNIA REPAIR     8 months old   NASAL ENDOSCOPY  12/27/2019   NASAL SEPTOPLASTY W/ TURBINOPLASTY Bilateral 09/28/2022   Procedure: NASAL SEPTOPLASTY WITH BILATERAL TURBINATE REDUCTION;  Surgeon: Reynold Caves, MD;  Location: [facility];  Service: ENT;  Laterality: Bilateral;   TONSILLECTOMY     VASECTOMY  06/2017    Current Outpatient Medications  Medication Instructions   psyllium (REGULOID) 0.52 g, Daily   rosuvastatin  (CRESTOR ) 5 mg, Oral, Daily at bedtime       Objective:   Physical Exam BP 126/82   Pulse 67   Temp 98.2 F (36.8 C) (Oral)   Resp 16   Ht 6' (1.829 m)   Wt 215 lb (97.5 kg)   SpO2 98%   BMI 29.16 kg/m  General: Well developed, NAD, BMI noted Neck: No  thyromegaly  HEENT:  Normocephalic . Face symmetric, atraumatic Lungs:  CTA B Normal respiratory effort, no intercostal retractions, no accessory muscle use. Heart: RRR,  no murmur.  Abdomen:  Not distended, soft, non-tender. No rebound or rigidity.   Lower extremities: no pretibial edema bilaterally  Skin: Exposed areas without rash. Not pale. Not jaundice Neurologic:  alert & oriented X3.  Speech normal, gait appropriate for age and unassisted Strength symmetric and appropriate for age.  Psych: Cognition and judgment appear intact.  Cooperative with normal attention span and concentration.  Behavior appropriate. No anxious or depressed appearing.     Assessment   Assessment Hyperlipidemia Chronic rhinitis, snoring + FH high cholesterol  L  epididymal cyst , [place]  x2 last 09/2020  PLAN Here for CPX - Tdap: 2018 -  had a covid vax last year ~ 07/2023, thus UTD - Rec Flu shot q year -CCS: Not indicated -Prostate cancer screening: FH Father age 71, start age 81  -Labs: CMP FLP CBC -Lifestyle: Doing well, runs regularly, ~ 2 miles every day  Other issues:  Hyperlipidemia, continue rosuvastatin , checking labs. Chronic rhinitis: Episodic symptoms, recommend Flonase as needed RTC 1 year    Hyperlipidemia: Encouraged to continue with a healthy lifestyle.  Check FLP.  Continue Crestor . Chronic rhinitis, snoring: Had a nasal septoplasty bilaterally 09/28/2022.  Rhinitis symptoms much improved.  Still snoring.  Epworth scale: 6, negative for sleep apnea.  If his snoring is an issue he could use a dental appliance. RTC 1 year

## 2024-03-07 NOTE — Patient Instructions (Signed)
   GO TO THE LAB : Get the blood work     Next office visit for a physical exam in 1 year Please make an appointment before you leave today

## 2024-03-08 ENCOUNTER — Encounter: Payer: Self-pay | Admitting: Internal Medicine

## 2024-03-08 ENCOUNTER — Ambulatory Visit: Payer: Self-pay | Admitting: Internal Medicine

## 2024-03-08 MED ORDER — ROSUVASTATIN CALCIUM 10 MG PO TABS: 10.0000 mg | ORAL_TABLET | Freq: Every day | ORAL | 1 refills | Status: DC

## 2024-03-08 NOTE — Addendum Note (Signed)
 Addended by: Nimra Puccinelli D on: 03/08/2024 03:42 PM   Modules accepted: Orders

## 2024-03-08 NOTE — Assessment & Plan Note (Signed)
 Here for CPX - Tdap: 2018 -  had a covid vax last year ~ 07/2023, thus UTD - Rec Flu shot q year -CCS: Not indicated -Prostate cancer screening: FH Father age 43, start age 37  -Labs: CMP FLP CBC -Lifestyle: Doing well, runs regularly, ~ 2 miles every day

## 2024-03-08 NOTE — Assessment & Plan Note (Signed)
 Here for CPX  Other issues:  Hyperlipidemia, continue rosuvastatin , checking labs. Chronic rhinitis: Episodic symptoms, recommend Flonase as needed RTC 1 year

## 2024-03-25 ENCOUNTER — Other Ambulatory Visit: Payer: Self-pay | Admitting: Internal Medicine

## 2024-04-19 ENCOUNTER — Other Ambulatory Visit (INDEPENDENT_AMBULATORY_CARE_PROVIDER_SITE_OTHER)

## 2024-04-19 DIAGNOSIS — E785 Hyperlipidemia, unspecified: Secondary | ICD-10-CM | POA: Diagnosis not present

## 2024-04-19 LAB — LIPID PANEL
Cholesterol: 180 mg/dL (ref 0–200)
HDL: 64.6 mg/dL (ref >39.00)
LDL Cholesterol: 90 mg/dL (ref 0–99)
NonHDL: 115.56
Total CHOL/HDL Ratio: 3
Triglycerides: 128 mg/dL (ref 0.0–149.0)
VLDL: 25.6 mg/dL (ref 0.0–40.0)

## 2024-04-19 LAB — ALT: ALT: 26 U/L (ref 0–53)

## 2024-04-19 LAB — AST: AST: 24 U/L (ref 0–37)

## 2024-07-16 ENCOUNTER — Encounter: Payer: Self-pay | Admitting: Internal Medicine

## 2024-08-31 ENCOUNTER — Other Ambulatory Visit: Payer: Self-pay | Admitting: Internal Medicine

## 2025-03-09 ENCOUNTER — Encounter: Admitting: Internal Medicine
